# Patient Record
Sex: Female | Born: 1980 | Race: Black or African American | Hispanic: No | Marital: Single | Smoking: Current some day smoker
Health system: Southern US, Community
[De-identification: ages and names within clinical notes are randomized; demographics above are authoritative.]

## PROBLEM LIST (undated history)

## (undated) ENCOUNTER — Inpatient Hospital Stay (HOSPITAL_COMMUNITY): Payer: Self-pay

## (undated) DIAGNOSIS — D573 Sickle-cell trait: Secondary | ICD-10-CM

## (undated) DIAGNOSIS — R87619 Unspecified abnormal cytological findings in specimens from cervix uteri: Secondary | ICD-10-CM

## (undated) DIAGNOSIS — IMO0002 Reserved for concepts with insufficient information to code with codable children: Secondary | ICD-10-CM

## (undated) HISTORY — PX: DILATION AND CURETTAGE OF UTERUS: SHX78

## (undated) HISTORY — DX: Sickle-cell trait: D57.3

---

## 2002-08-06 ENCOUNTER — Emergency Department (HOSPITAL_COMMUNITY): Admission: EM | Admit: 2002-08-06 | Discharge: 2002-08-07 | Payer: Self-pay | Admitting: Emergency Medicine

## 2002-08-07 ENCOUNTER — Encounter: Payer: Self-pay | Admitting: Emergency Medicine

## 2004-11-01 ENCOUNTER — Emergency Department (HOSPITAL_COMMUNITY): Admission: EM | Admit: 2004-11-01 | Discharge: 2004-11-01 | Payer: Self-pay | Admitting: Emergency Medicine

## 2004-11-24 ENCOUNTER — Inpatient Hospital Stay (HOSPITAL_COMMUNITY): Admission: AD | Admit: 2004-11-24 | Discharge: 2004-11-24 | Payer: Self-pay | Admitting: *Deleted

## 2005-01-29 ENCOUNTER — Ambulatory Visit (HOSPITAL_COMMUNITY): Admission: RE | Admit: 2005-01-29 | Discharge: 2005-01-29 | Payer: Self-pay | Admitting: *Deleted

## 2005-05-18 ENCOUNTER — Observation Stay (HOSPITAL_COMMUNITY): Admission: AD | Admit: 2005-05-18 | Discharge: 2005-05-19 | Payer: Self-pay | Admitting: *Deleted

## 2005-05-26 ENCOUNTER — Ambulatory Visit (HOSPITAL_COMMUNITY): Admission: RE | Admit: 2005-05-26 | Discharge: 2005-05-26 | Payer: Self-pay | Admitting: *Deleted

## 2005-06-15 ENCOUNTER — Inpatient Hospital Stay (HOSPITAL_COMMUNITY): Admission: AD | Admit: 2005-06-15 | Discharge: 2005-06-18 | Payer: Self-pay | Admitting: Obstetrics and Gynecology

## 2005-06-15 ENCOUNTER — Ambulatory Visit: Payer: Self-pay | Admitting: Obstetrics and Gynecology

## 2006-12-10 ENCOUNTER — Emergency Department (HOSPITAL_COMMUNITY): Admission: EM | Admit: 2006-12-10 | Discharge: 2006-12-10 | Payer: Self-pay | Admitting: Emergency Medicine

## 2007-03-22 ENCOUNTER — Inpatient Hospital Stay (HOSPITAL_COMMUNITY): Admission: AD | Admit: 2007-03-22 | Discharge: 2007-03-23 | Payer: Self-pay | Admitting: Obstetrics & Gynecology

## 2007-03-25 ENCOUNTER — Inpatient Hospital Stay (HOSPITAL_COMMUNITY): Admission: AD | Admit: 2007-03-25 | Discharge: 2007-03-25 | Payer: Self-pay | Admitting: Obstetrics & Gynecology

## 2007-03-25 ENCOUNTER — Encounter: Payer: Self-pay | Admitting: Obstetrics & Gynecology

## 2007-09-12 ENCOUNTER — Ambulatory Visit (HOSPITAL_COMMUNITY): Admission: RE | Admit: 2007-09-12 | Discharge: 2007-09-12 | Payer: Self-pay | Admitting: Obstetrics & Gynecology

## 2008-01-16 ENCOUNTER — Inpatient Hospital Stay (HOSPITAL_COMMUNITY): Admission: AD | Admit: 2008-01-16 | Discharge: 2008-01-16 | Payer: Self-pay | Admitting: Obstetrics & Gynecology

## 2008-01-16 ENCOUNTER — Ambulatory Visit: Payer: Self-pay | Admitting: Advanced Practice Midwife

## 2008-02-02 ENCOUNTER — Ambulatory Visit (HOSPITAL_COMMUNITY): Admission: RE | Admit: 2008-02-02 | Discharge: 2008-02-02 | Payer: Self-pay | Admitting: Family Medicine

## 2008-02-15 ENCOUNTER — Inpatient Hospital Stay (HOSPITAL_COMMUNITY): Admission: AD | Admit: 2008-02-15 | Discharge: 2008-02-17 | Payer: Self-pay | Admitting: Obstetrics & Gynecology

## 2008-02-15 ENCOUNTER — Ambulatory Visit: Payer: Self-pay | Admitting: Physician Assistant

## 2010-11-19 LAB — WET PREP, GENITAL: Trich, Wet Prep: NONE SEEN

## 2010-11-19 LAB — GC/CHLAMYDIA PROBE AMP, GENITAL: GC Probe Amp, Genital: NEGATIVE

## 2010-11-19 LAB — CBC
Hemoglobin: 12.7
MCHC: 34.4
MCV: 82.3
RBC: 4.48
WBC: 12.1 — ABNORMAL HIGH

## 2010-11-19 LAB — POCT PREGNANCY, URINE
Operator id: 12087
Preg Test, Ur: POSITIVE

## 2010-12-04 LAB — CBC
HCT: 38.1 % (ref 36.0–46.0)
MCHC: 33.7 g/dL (ref 30.0–36.0)
MCV: 82.2 fL (ref 78.0–100.0)
Platelets: 215 10*3/uL (ref 150–400)
WBC: 9.4 10*3/uL (ref 4.0–10.5)

## 2011-02-02 ENCOUNTER — Emergency Department (INDEPENDENT_AMBULATORY_CARE_PROVIDER_SITE_OTHER)
Admission: EM | Admit: 2011-02-02 | Discharge: 2011-02-02 | Disposition: A | Discharge status: Home / Self Care | Payer: Self-pay | Source: Home / Self Care | Attending: Family Medicine | Admitting: Family Medicine

## 2011-02-02 ENCOUNTER — Encounter: Payer: Self-pay | Admitting: Emergency Medicine

## 2011-02-02 DIAGNOSIS — Z331 Pregnant state, incidental: Secondary | ICD-10-CM

## 2011-02-02 LAB — POCT URINALYSIS DIP (DEVICE)
Ketones, ur: NEGATIVE mg/dL
Protein, ur: NEGATIVE mg/dL
Specific Gravity, Urine: >=1.03 (ref 1.005–1.030)
Urobilinogen, UA: 0.2 mg/dL (ref 0.0–1.0)
pH: 6 (ref 5.0–8.0)

## 2011-02-02 NOTE — ED Notes (Signed)
Pt here with lower back pain radiating down bilat legs x 1wk and late menstrual x 1 mnth.sx cramping intermitt with worsening this past 2 dys.no vomiting or fevers reported

## 2011-02-02 NOTE — ED Provider Notes (Addendum)
History     CSN: [id number] Arrival date & time: 02/02/2011  1:50 PM   First MD Initiated Contact with Patient 02/02/11 1421      Chief Complaint  Patient presents with  . Back Pain  . Late Period    (Consider location/radiation/quality/duration/timing/severity/associated sxs/prior treatment) Patient is a 30 y.o. female presenting with back pain. The history is provided by the patient.  Back Pain  This is a new problem. The current episode started 2 days ago. The problem occurs constantly. The problem has not changed since onset.The pain is associated with no known injury (lmp oct 12). The pain is present in the lumbar spine. The quality of the pain is described as cramping. The pain does not radiate. The pain is mild.    No past medical history on file.  No past surgical history on file.  No family history on file.  History  Substance Use Topics  . Smoking status: Not on file  . Smokeless tobacco: Not on file  . Alcohol Use: Not on file    OB History    No data available      Review of Systems  Constitutional: Negative.   Gastrointestinal: Negative.   Genitourinary: Negative for vaginal bleeding and vaginal discharge.  Musculoskeletal: Positive for back pain.    Allergies  Review of patient's allergies indicates not on file.  Home Medications  No current outpatient prescriptions on file.  BP 102/43  Pulse 65  Temp(Src) 99 F (37.2 C) (Oral)  Resp 16  SpO2 100%  Physical Exam  Nursing note and vitals reviewed. Constitutional: She is oriented to person, place, and time. She appears well-developed and well-nourished.  Abdominal: Soft. Bowel sounds are normal. She exhibits no mass. There is no tenderness. There is no rebound and no guarding.  Neurological: She is alert and oriented to person, place, and time.  Skin: Skin is warm and dry.    ED Course  Procedures (including critical care time)   Labs Reviewed  POCT URINALYSIS DIPSTICK  POCT  PREGNANCY, URINE   No results found.   No diagnosis found.    MDM   upreg--pos   , u/a neg.Barkley Bruns, MD 02/02/11 1605  Barkley Bruns, MD 02/02/11 [phone]

## 2011-03-02 NOTE — L&D Delivery Note (Signed)
Delivery Note At 11:29 AM a viable and healthy female was delivered via Vaginal, Spontaneous Delivery (Presentation: Occiput Posterior).  APGAR: pending ; weight pending.   Placenta status: intact.  Cord: 3 vessels with the following complications: none.   Anesthesia: Epidural  Episiotomy: none  Lacerations: 1st degree perineal Suture Repair: none Est. Blood Loss (mL): 350  Mom to postpartum, to OR in 1 hour for BTL.  Baby to nursery-stable.  Diana Liu 09/11/2011, 11:52 AM

## 2011-04-19 ENCOUNTER — Other Ambulatory Visit (HOSPITAL_COMMUNITY): Payer: Self-pay | Admitting: Physician Assistant

## 2011-04-19 DIAGNOSIS — Z3689 Encounter for other specified antenatal screening: Secondary | ICD-10-CM

## 2011-04-19 LAB — OB RESULTS CONSOLE ABO/RH

## 2011-04-19 LAB — OB RESULTS CONSOLE ANTIBODY SCREEN: Antibody Screen: NEGATIVE

## 2011-04-19 LAB — OB RESULTS CONSOLE HIV ANTIBODY (ROUTINE TESTING): HIV: NONREACTIVE

## 2011-04-19 LAB — OB RESULTS CONSOLE RUBELLA ANTIBODY, IGM: Rubella: IMMUNE

## 2011-04-19 LAB — OB RESULTS CONSOLE HEPATITIS B SURFACE ANTIGEN: Hepatitis B Surface Ag: NEGATIVE

## 2011-04-23 ENCOUNTER — Ambulatory Visit (HOSPITAL_COMMUNITY)
Admission: RE | Admit: 2011-04-23 | Discharge: 2011-04-23 | Disposition: A | Discharge status: Home / Self Care | Payer: Medicaid Other | Source: Ambulatory Visit | Attending: Physician Assistant | Admitting: Physician Assistant

## 2011-04-23 DIAGNOSIS — Z1389 Encounter for screening for other disorder: Secondary | ICD-10-CM | POA: Insufficient documentation

## 2011-04-23 DIAGNOSIS — Z363 Encounter for antenatal screening for malformations: Secondary | ICD-10-CM | POA: Insufficient documentation

## 2011-04-23 DIAGNOSIS — Z3689 Encounter for other specified antenatal screening: Secondary | ICD-10-CM

## 2011-04-23 DIAGNOSIS — O358XX0 Maternal care for other (suspected) fetal abnormality and damage, not applicable or unspecified: Secondary | ICD-10-CM | POA: Insufficient documentation

## 2011-05-13 ENCOUNTER — Encounter: Payer: Medicaid Other | Admitting: Physician Assistant

## 2011-05-19 ENCOUNTER — Other Ambulatory Visit (HOSPITAL_COMMUNITY): Payer: Self-pay | Admitting: Physician Assistant

## 2011-05-28 ENCOUNTER — Ambulatory Visit (HOSPITAL_COMMUNITY)
Admission: RE | Admit: 2011-05-28 | Discharge: 2011-05-28 | Disposition: A | Discharge status: Home / Self Care | Payer: Medicaid Other | Source: Ambulatory Visit | Attending: Physician Assistant | Admitting: Physician Assistant

## 2011-05-28 DIAGNOSIS — Z3689 Encounter for other specified antenatal screening: Secondary | ICD-10-CM | POA: Insufficient documentation

## 2011-05-28 DIAGNOSIS — O3660X0 Maternal care for excessive fetal growth, unspecified trimester, not applicable or unspecified: Secondary | ICD-10-CM | POA: Insufficient documentation

## 2011-06-02 ENCOUNTER — Encounter: Payer: Self-pay | Admitting: Physician Assistant

## 2011-06-02 ENCOUNTER — Ambulatory Visit (INDEPENDENT_AMBULATORY_CARE_PROVIDER_SITE_OTHER): Payer: Medicaid Other | Admitting: Physician Assistant

## 2011-06-02 VITALS — BP 130/68 | HR 96 | Temp 98.3°F | Resp 16 | Ht 66.0 in | Wt 188.4 lb

## 2011-06-02 DIAGNOSIS — R87613 High grade squamous intraepithelial lesion on cytologic smear of cervix (HGSIL): Secondary | ICD-10-CM | POA: Insufficient documentation

## 2011-06-02 DIAGNOSIS — IMO0002 Reserved for concepts with insufficient information to code with codable children: Secondary | ICD-10-CM | POA: Insufficient documentation

## 2011-06-02 NOTE — Progress Notes (Signed)
Pt presents for colpo secondary to HSIL.Pt is [redacted]w[redacted]d pregnant with no prior abnormal paps. Patient given informed consent, signed copy in the chart, time out was performed.  Placed in lithotomy position. Cervix viewed with speculum and colposcope after application of acetic acid.   Colposcopy adequate?  Yes Acetowhite lesions?No Punctation?No Mosaicism?  No Abnormal vasculature?  No Biopsies?No ECC?NO  COMMENTS:Needs repeat colpo postpartum with ECC. Patient was given post procedure instructions.  Results shared with patient. FU as scheduled for [facility]. FU in gyn clinic 8-12 weeks postpartum for repeat colpo

## 2011-06-02 NOTE — Progress Notes (Signed)
Pt is receiving prenatal care from [facility]

## 2011-06-02 NOTE — Patient Instructions (Signed)
Colposcopy Colposcopy is a procedure that uses a special lighted microscope (colposcope). It examines your cervix and vagina, or the area around the outside of the vagina, for signs of disease or abnormalities in the cells. You may be sent to a specialist (gynecologist) to do the colposcopy. A biopsy (tissue sample) may be collected during a colposcopy, if the caregiver finds any unusual cells. The biopsy is sent to the lab for further testing, and the results are reported back to your caregiver. A WOMAN MAY NEED THIS PROCEDURE IF:  She has had an abnormal pap smear (taking cells from the cervix for testing).   She has a sore on her cervix, and a Pap test was normal.   The Pap test suggests human papilloma virus (HPV). This virus can cause genital warts and is linked to the development of cervical cancer.   She has genital warts on the cervix, or in or around the outside of the vagina.   Her mother took the drug DES while pregnant.   She has painful intercourse.   She has vaginal bleeding, especially after sexual intercourse.   There is a need to evaluate the results of previous treatment.  BEFORE THE PROCEDURE   Colposcopy is done when you are not having a menstrual period.   For 24 hours before the colposcopy, do not:   Douche.   Use tampons.   Use medicines, creams, or suppositories in the vagina.   Have sexual intercourse.  PROCEDURE   A colposcopy is done while a woman is lying on her back with her feet in foot rests (stirrups).   A speculum is placed inside the vagina to keep it open and to allow the caregiver to see the cervix. This is the same instrument used to do a pap smear.   The colposcope is placed outside the vagina. It is used to magnify and examine the cervix, vagina, and the area around the outside of the vagina.   A small amount of liquid solution is placed on the area that is to be viewed. This solution is placed on with a cotton applicator. This solution  makes it easier to see the abnormal cells.   Your caregiver will suck out mucus and cells from the canal of the cervix.   Small pieces of tissue for biopsy may be taken at the same time. You may feel mild pain or discomfort when this is done.   Your caregiver will record the location of the abnormal areas and send the tissue samples to a lab for analysis.   If your caregiver biopsies the vagina or outside of the vagina, a local anesthetic (novocaine) is usually given.  AFTER THE PROCEDURE   You may have some cramping that often goes away in a few minutes. You may have some soreness for a couple of days.   You may take over-the-counter pain medicine as advised by your caregiver. Do not take aspirin because it can cause bleeding.   Lie down for a few minutes if you feel lightheaded.   You may have some bleeding or dark discharge that should stop in a few days.   You may need to wear a sanitary pad for a few days.  HOME CARE INSTRUCTIONS   Avoid sex, douching, and using tampons for a week or as directed.   Only take medicine as directed by your caregiver.   Continue to take birth control pills, if you are on them.   Not all test results are   available during your visit. If your test results are not back during the visit, make an appointment with your caregiver to find out the results. Do not assume everything is normal if you have not heard from your caregiver or the medical facility. It is important for you to follow up on all of your test results.   Follow your caregiver's advice regarding medicines, activity, follow-up visits, and follow-up Pap tests.  SEEK MEDICAL CARE IF:   You develop a rash.   You have problems with your medicine.  SEEK IMMEDIATE MEDICAL CARE IF:  You are bleeding heavily or are passing blood clots.   You develop a fever over 102 F (38.9 C), with or without chills.   You have abnormal vaginal discharge.   You are having cramps that do not go away  after taking your pain medicine.   You feel lightheaded, dizzy, or faint.   You develop stomach pain.  Document Released: 05/08/2002 Document Revised: 02/04/2011 Document Reviewed: 12/19/2008 [employer] Patient Information 2012 [employer]. 

## 2011-08-06 ENCOUNTER — Inpatient Hospital Stay (HOSPITAL_COMMUNITY)
Admission: RE | Admit: 2011-08-06 | Discharge: 2011-08-06 | Disposition: A | Discharge status: Home / Self Care | Payer: Medicaid Other | Source: Ambulatory Visit | Attending: Obstetrics & Gynecology | Admitting: Obstetrics & Gynecology

## 2011-08-06 ENCOUNTER — Encounter (HOSPITAL_COMMUNITY): Payer: Self-pay | Admitting: *Deleted

## 2011-08-06 DIAGNOSIS — O99891 Other specified diseases and conditions complicating pregnancy: Secondary | ICD-10-CM | POA: Insufficient documentation

## 2011-08-06 DIAGNOSIS — B9789 Other viral agents as the cause of diseases classified elsewhere: Secondary | ICD-10-CM | POA: Insufficient documentation

## 2011-08-06 DIAGNOSIS — R000 Tachycardia, unspecified: Secondary | ICD-10-CM | POA: Insufficient documentation

## 2011-08-06 DIAGNOSIS — B349 Viral infection, unspecified: Secondary | ICD-10-CM

## 2011-08-06 DIAGNOSIS — O10019 Pre-existing essential hypertension complicating pregnancy, unspecified trimester: Secondary | ICD-10-CM

## 2011-08-06 DIAGNOSIS — H5710 Ocular pain, unspecified eye: Secondary | ICD-10-CM | POA: Insufficient documentation

## 2011-08-06 HISTORY — DX: Reserved for concepts with insufficient information to code with codable children: IMO0002

## 2011-08-06 HISTORY — DX: Unspecified abnormal cytological findings in specimens from cervix uteri: R87.619

## 2011-08-06 LAB — DIFFERENTIAL
Basophils Absolute: 0 10*3/uL (ref 0.0–0.1)
Lymphocytes Relative: 9 % — ABNORMAL LOW (ref 12–46)
Monocytes Absolute: 0.7 10*3/uL (ref 0.1–1.0)
Neutro Abs: 8.2 10*3/uL — ABNORMAL HIGH (ref 1.7–7.7)
Neutrophils Relative %: 83 % — ABNORMAL HIGH (ref 43–77)

## 2011-08-06 LAB — URINALYSIS, ROUTINE W REFLEX MICROSCOPIC
Bilirubin Urine: NEGATIVE
Hgb urine dipstick: NEGATIVE
Ketones, ur: NEGATIVE mg/dL
Nitrite: NEGATIVE
Specific Gravity, Urine: 1.015 (ref 1.005–1.030)
pH: 6 (ref 5.0–8.0)

## 2011-08-06 LAB — CBC
Hemoglobin: 11.3 g/dL — ABNORMAL LOW (ref 12.0–15.0)
MCH: 26.9 pg (ref 26.0–34.0)
MCHC: 35.8 g/dL (ref 30.0–36.0)
MCV: 75.2 fL — ABNORMAL LOW (ref 78.0–100.0)
RBC: 4.2 MIL/uL (ref 3.87–5.11)

## 2011-08-06 LAB — COMPREHENSIVE METABOLIC PANEL
ALT: 15 U/L (ref 0–35)
AST: 17 U/L (ref 0–37)
Alkaline Phosphatase: 122 U/L — ABNORMAL HIGH (ref 39–117)
CO2: 25 mEq/L (ref 19–32)
Chloride: 101 mEq/L (ref 96–112)
GFR calc non Af Amer: >90 mL/min (ref >90)
Potassium: 4.1 mEq/L (ref 3.5–5.1)
Sodium: 134 mEq/L — ABNORMAL LOW (ref 135–145)
Total Bilirubin: 0.3 mg/dL (ref 0.3–1.2)

## 2011-08-06 NOTE — MAU Note (Signed)
Pt sent over from [facility] for weakness and fatigue, reports pain in eyes- worse with lights, states she believes her heart is racing.  States all she does is sleep, too fatigued to do anything else.  Denies any abdominal pain, bleeding, discharge or leaking of fluid.  + FM.

## 2011-08-06 NOTE — MAU Provider Note (Signed)
History    Diana Liu is a 31 yo [phone] at 34wk 1 day who presents today with a  3 day h/o intermittent fatigue, body aches, blurry vision with :black spots" and tachycardia. Pt reports fatigue at baseline but has worsened over the past 3 days. Body aches of arms and legs to the point she feels her legs want to "give out". Pt reports intermittent HA assoc with vision changes and dizziness lasting 30 min with each episode. Pt has tried Tylenol for HA with some relief. Otherwise, sxs relieved with "lying down". Pt denies hx similar issue during pregnancy.  Pt has been eating 2 meals/day and staying hydrated.No changes to UO.  Denies fluid loss, blood loss, abd pain. Endorses Braxton Hicks contractions - 3x/night currently.  CSN: [id number]  Arrival date and time: 08/06/11 1115   First Provider Initiated Contact with Patient 08/06/11 1228      Chief Complaint  Patient presents with  . Eye Pain  . Tachycardia   Eye Pain  There is pain in both eyes. This is a new problem. The current episode started in the past 7 days. The problem occurs 2 to 4 times per day. The problem has been unchanged. There was no injury mechanism. The pain is at a severity of 6/10. The pain is mild. There is no known exposure to pink eye. Associated symptoms include blurred vision (intermittently), an eye discharge (watery) and photophobia (dim light in MAU room causes sensitivity). Pertinent negatives include no double vision, eye redness, fever, itching, nausea, vomiting or weakness. She has tried nothing for the symptoms.     Past Medical History  Diagnosis Date  . Sickle cell trait   . Abnormal Pap smear     Past Surgical History  Procedure Date  . Dilation and curettage of uterus     Family History  Problem Relation Age of Onset  . Hypertension Mother   . Diabetes Mother   . Anesthesia problems Neg Hx     History  Substance Use Topics  . Smoking status: Never Smoker   . Smokeless tobacco: Not on  file  . Alcohol Use: No    Allergies: No Known Allergies  Prescriptions prior to admission  Medication Sig Dispense Refill  . calcium carbonate (TUMS - DOSED IN MG ELEMENTAL CALCIUM) 500 MG chewable tablet Chew 1 tablet by mouth daily as needed. heartburn      . Prenatal Vit-Fe Fumarate-FA (PRENATAL MULTIVITAMIN) TABS Take 1 tablet by mouth daily.      Marland Kitchen sulfamethoxazole-trimethoprim (BACTRIM DS) 800-160 MG per tablet Take 1 tablet by mouth at bedtime.        Review of Systems  Constitutional: Positive for malaise/fatigue (baseline fatigue with increased severity and frequency over past 3 days). Negative for fever, chills and diaphoresis.  HENT: Negative for ear pain, congestion, sore throat and tinnitus.   Eyes: Positive for blurred vision (intermittently), photophobia (dim light in MAU room causes sensitivity), pain and discharge (watery). Negative for double vision and redness.  Respiratory: Positive for shortness of breath (at baseline worsening in 3rd trimester). Negative for cough, wheezing and stridor.   Cardiovascular: Positive for palpitations (tachycardia). Negative for chest pain and leg swelling.  Gastrointestinal: Positive for heartburn. Negative for nausea, vomiting, abdominal pain, diarrhea and constipation.  Genitourinary: Negative for dysuria, urgency, frequency and flank pain.  Musculoskeletal: Positive for myalgias (bilateral legs and arms).  Skin: Negative.  Negative for itching and rash.  Neurological: Positive for dizziness (with HA episodes) and  headaches (intermittent, lasting -1hr, associated with vision changes). Negative for seizures and weakness.  Psychiatric/Behavioral: Negative for depression. The patient is not nervous/anxious.    Physical Exam   Blood pressure 128/69, pulse 103, temperature 98.9 F (37.2 C), temperature source Oral, resp. rate 18, height 5\' 6"  (1.676 m), weight 91.627 kg (202 lb), last menstrual period 12/10/2010.  Physical Exam    Constitutional: She appears well-developed and well-nourished. She appears distressed.  HENT:  Head: Normocephalic.  Eyes: Conjunctivae and EOM are normal. Pupils are equal, round, and reactive to light. Right eye exhibits discharge (watery). Right eye exhibits no exudate. Left eye exhibits discharge. Left eye exhibits no exudate. Right conjunctiva is not injected. Left conjunctiva is not injected. No scleral icterus.  Cardiovascular: Regular rhythm, normal heart sounds and intact distal pulses.   No murmur heard. Respiratory: Effort normal and breath sounds normal. No respiratory distress. She has no wheezes. She has no rales.  GI: She exhibits distension and mass. There is no tenderness.  Genitourinary: No vaginal discharge found.  Musculoskeletal: She exhibits no edema.  Neurological: She is alert.  Skin: Skin is warm and dry. No rash noted. She is not diaphoretic.   Results for orders placed during the hospital encounter of 08/06/11 (from the past 24 hour(s))  CBC     Status: Abnormal   Collection Time   08/06/11 12:34 PM      Component Value Range   WBC 10.0  4.0 - 10.5 (K/uL)   RBC 4.20  3.87 - 5.11 (MIL/uL)   Hemoglobin 11.3 (*) 12.0 - 15.0 (g/dL)   HCT 52.8 (*) 41.3 - 46.0 (%)   MCV 75.2 (*) 78.0 - 100.0 (fL)   MCH 26.9  26.0 - 34.0 (pg)   MCHC 35.8  30.0 - 36.0 (g/dL)   RDW 24.4  01.0 - 27.2 (%)   Platelets 186  150 - 400 (K/uL)  DIFFERENTIAL     Status: Abnormal   Collection Time   08/06/11 12:34 PM      Component Value Range   Neutrophils Relative 83 (*) 43 - 77 (%)   Neutro Abs 8.2 (*) 1.7 - 7.7 (K/uL)   Lymphocytes Relative 9 (*) 12 - 46 (%)   Lymphs Abs 0.9  0.7 - 4.0 (K/uL)   Monocytes Relative 7  3 - 12 (%)   Monocytes Absolute 0.7  0.1 - 1.0 (K/uL)   Eosinophils Relative 1  0 - 5 (%)   Eosinophils Absolute 0.1  0.0 - 0.7 (K/uL)   Basophils Relative 0  0 - 1 (%)   Basophils Absolute 0.0  0.0 - 0.1 (K/uL)  COMPREHENSIVE METABOLIC PANEL     Status: Abnormal    Collection Time   08/06/11 12:34 PM      Component Value Range   Sodium 134 (*) 135 - 145 (mEq/L)   Potassium 4.1  3.5 - 5.1 (mEq/L)   Chloride 101  96 - 112 (mEq/L)   CO2 25  19 - 32 (mEq/L)   Glucose, Bld 101 (*) 70 - 99 (mg/dL)   BUN 5 (*) 6 - 23 (mg/dL)   Creatinine, Ser 5.36  0.50 - 1.10 (mg/dL)   Calcium 9.1  8.4 - 64.4 (mg/dL)   Total Protein 6.8  6.0 - 8.3 (g/dL)   Albumin 3.1 (*) 3.5 - 5.2 (g/dL)   AST 17  0 - 37 (U/L)   ALT 15  0 - 35 (U/L)   Alkaline Phosphatase 122 (*) 39 - 117 (  U/L)   Total Bilirubin 0.3  0.3 - 1.2 (mg/dL)   GFR calc non Af Amer >90  >90 (mL/min)   GFR calc Af Amer >90  >90 (mL/min)  URINALYSIS, ROUTINE W REFLEX MICROSCOPIC     Status: Abnormal   Collection Time   08/06/11 12:36 PM      Component Value Range   Color, Urine YELLOW  YELLOW    APPearance CLEAR  CLEAR    Specific Gravity, Urine 1.015  1.005 - 1.030    pH 6.0  5.0 - 8.0    Glucose, UA 500 (*) NEGATIVE (mg/dL)   Hgb urine dipstick NEGATIVE  NEGATIVE    Bilirubin Urine NEGATIVE  NEGATIVE    Ketones, ur NEGATIVE  NEGATIVE (mg/dL)   Protein, ur NEGATIVE  NEGATIVE (mg/dL)   Urobilinogen, UA 1.0  0.0 - 1.0 (mg/dL)   Nitrite NEGATIVE  NEGATIVE    Leukocytes, UA NEGATIVE  NEGATIVE      MAU Course  Procedures NST: Baseline rate of 160 with accelerations seen. Occasional contraction. No decelerations noted.  Assessment and Plan  #1 intrauterine pregnancy at 34 weeks 1 day #2 viral illness  Lab testing shows no acute infection of symptoms consistent with viral etiology. Preeclamptic warnings given to the patient with instructions to return if develops headache that it does not respond to Tylenol or she has labor symptoms. Patient will followup in one week with health department.  Feser, Danielle 08/06/2011, 1:49 PM   Patient seen - agree with above note.

## 2011-08-06 NOTE — Discharge Instructions (Signed)
Viral Infections  A virus is a type of germ. Viruses can cause:   Minor sore throats.   Aches and pains.   Headaches.   Runny nose.   Rashes.   Watery eyes.   Tiredness.   Coughs.   Loss of appetite.   Feeling sick to your stomach (nausea).   Throwing up (vomiting).   Watery poop (diarrhea).  HOME CARE     Only take medicines as told by your doctor.   Drink enough water and fluids to keep your pee (urine) clear or pale yellow. Sports drinks are a good choice.   Get plenty of rest and eat healthy. Soups and broths with crackers or rice are fine.  GET HELP RIGHT AWAY IF:     You have a very bad headache.   You have shortness of breath.   You have chest pain or neck pain.   You have an unusual rash.   You cannot stop throwing up.   You have watery poop that does not stop.   You cannot keep fluids down.   You or your child has a temperature by mouth above 102 F (38.9 C), not controlled by medicine.   Your baby is older than 3 months with a rectal temperature of 102 F (38.9 C) or higher.   Your baby is 3 months old or younger with a rectal temperature of 100.4 F (38 C) or higher.  MAKE SURE YOU:     Understand these instructions.   Will watch this condition.   Will get help right away if you are not doing well or get worse.  Document Released: 01/29/2008 Document Revised: 02/04/2011 Document Reviewed: 06/23/2010  [employer] Patient Information 2012 [employer].

## 2011-08-19 ENCOUNTER — Other Ambulatory Visit (HOSPITAL_COMMUNITY): Payer: Self-pay | Admitting: Physician Assistant

## 2011-08-19 DIAGNOSIS — O289 Unspecified abnormal findings on antenatal screening of mother: Secondary | ICD-10-CM

## 2011-08-20 ENCOUNTER — Other Ambulatory Visit (HOSPITAL_COMMUNITY): Payer: Self-pay | Admitting: Physician Assistant

## 2011-08-20 ENCOUNTER — Ambulatory Visit (HOSPITAL_COMMUNITY)
Admission: RE | Admit: 2011-08-20 | Discharge: 2011-08-20 | Disposition: A | Discharge status: Home / Self Care | Payer: Medicaid Other | Source: Ambulatory Visit | Attending: Physician Assistant | Admitting: Physician Assistant

## 2011-08-20 DIAGNOSIS — O289 Unspecified abnormal findings on antenatal screening of mother: Secondary | ICD-10-CM

## 2011-08-20 DIAGNOSIS — Z3689 Encounter for other specified antenatal screening: Secondary | ICD-10-CM | POA: Insufficient documentation

## 2011-08-20 DIAGNOSIS — O3660X0 Maternal care for excessive fetal growth, unspecified trimester, not applicable or unspecified: Secondary | ICD-10-CM | POA: Insufficient documentation

## 2011-08-26 ENCOUNTER — Other Ambulatory Visit (HOSPITAL_COMMUNITY): Payer: Self-pay | Admitting: Physician Assistant

## 2011-08-26 DIAGNOSIS — O289 Unspecified abnormal findings on antenatal screening of mother: Secondary | ICD-10-CM

## 2011-08-30 ENCOUNTER — Other Ambulatory Visit (HOSPITAL_COMMUNITY): Payer: Self-pay | Admitting: Physician Assistant

## 2011-08-30 ENCOUNTER — Ambulatory Visit (HOSPITAL_COMMUNITY)
Admission: RE | Admit: 2011-08-30 | Discharge: 2011-08-30 | Disposition: A | Discharge status: Home / Self Care | Payer: Medicaid Other | Source: Ambulatory Visit | Attending: Physician Assistant | Admitting: Physician Assistant

## 2011-08-30 DIAGNOSIS — Z3689 Encounter for other specified antenatal screening: Secondary | ICD-10-CM | POA: Insufficient documentation

## 2011-08-30 DIAGNOSIS — O289 Unspecified abnormal findings on antenatal screening of mother: Secondary | ICD-10-CM

## 2011-09-06 ENCOUNTER — Ambulatory Visit (HOSPITAL_COMMUNITY)
Admission: RE | Admit: 2011-09-06 | Discharge: 2011-09-06 | Disposition: A | Discharge status: Home / Self Care | Payer: Medicaid Other | Source: Ambulatory Visit | Attending: Physician Assistant | Admitting: Physician Assistant

## 2011-09-06 ENCOUNTER — Inpatient Hospital Stay (HOSPITAL_COMMUNITY)
Admission: AD | Admit: 2011-09-06 | Discharge: 2011-09-06 | Disposition: A | Discharge status: Hospice | Payer: Medicaid Other | Source: Ambulatory Visit | Attending: Obstetrics & Gynecology | Admitting: Obstetrics & Gynecology

## 2011-09-06 ENCOUNTER — Encounter (HOSPITAL_COMMUNITY): Payer: Self-pay | Admitting: *Deleted

## 2011-09-06 ENCOUNTER — Inpatient Hospital Stay (HOSPITAL_COMMUNITY): Payer: Medicaid Other

## 2011-09-06 DIAGNOSIS — Z3689 Encounter for other specified antenatal screening: Secondary | ICD-10-CM | POA: Insufficient documentation

## 2011-09-06 DIAGNOSIS — O368390 Maternal care for abnormalities of the fetal heart rate or rhythm, unspecified trimester, not applicable or unspecified: Secondary | ICD-10-CM | POA: Insufficient documentation

## 2011-09-06 DIAGNOSIS — F17200 Nicotine dependence, unspecified, uncomplicated: Secondary | ICD-10-CM | POA: Diagnosis present

## 2011-09-06 DIAGNOSIS — O368190 Decreased fetal movements, unspecified trimester, not applicable or unspecified: Secondary | ICD-10-CM | POA: Insufficient documentation

## 2011-09-06 DIAGNOSIS — D582 Other hemoglobinopathies: Secondary | ICD-10-CM | POA: Diagnosis present

## 2011-09-06 DIAGNOSIS — O289 Unspecified abnormal findings on antenatal screening of mother: Secondary | ICD-10-CM

## 2011-09-06 DIAGNOSIS — Z3490 Encounter for supervision of normal pregnancy, unspecified, unspecified trimester: Secondary | ICD-10-CM

## 2011-09-06 NOTE — MAU Provider Note (Signed)
  History     CSN: [id number]  Arrival date and time: 09/06/11 1449   None     Chief Complaint  Patient presents with  . Decreased Fetal Movement  . Labor Eval   HPI Patient is a 31 yo [phone] at 63.4 EGA who presents due to decreased fetal activity and for labor evaluation.  Patient states that on July 4th she began to notice some contractions.  Currently she states she can feel them every 15 minutes or so.  Then last night she expelled a large amount of mucus.  There was no blood in this mucus. Since that time she noticed decreased fetal activity.  She states that previously her fetus was very active, but not nearly as much now.  She denies any vaginal bleeding and loss of fluid.  OB History    Grav Para Term Preterm Abortions TAB SAB Ect Mult Living   6 3 3  2 1 1   3     She receives her prenatal care with the [facility].  She states that they were concerned for "increased fluid" and large for gestational age.  Past Medical History  Diagnosis Date  . Sickle cell trait   . Abnormal Pap smear     Past Surgical History  Procedure Date  . Dilation and curettage of uterus     Family History  Problem Relation Age of Onset  . Hypertension Mother   . Diabetes Mother   . Anesthesia problems Neg Hx     History  Substance Use Topics  . Smoking status: Former Smoker -- 3 years    Types: Cigarettes  . Smokeless tobacco: Not on file  . Alcohol Use: No    Allergies: No Known Allergies  No prescriptions prior to admission    ROS negative except per HPI. Physical Exam   Blood pressure 127/72, pulse 92, temperature 99.2 F (37.3 C), temperature source Oral, resp. rate 18, height 5\' 5"  (1.651 m), weight 94.802 kg (209 lb), last menstrual period 12/10/2010, SpO2 100.00%.  Physical Exam  Constitutional: She appears well-developed and well-nourished.  Cardiovascular: Normal rate, regular rhythm and normal heart sounds.   Respiratory: Effort normal and breath sounds  normal. No respiratory distress.  GI:       Abdomen is gravid, firm during contraction, nontender   Fetal Heart tracing: baseline heart rate: 135, moderate variability, accels present, decels absent Contractions: irregular every 5-7 minutes     MAU Course  Procedures  BPP: 6/8 NST: reactive  >> Therfore, score is 8/10  Assessment and Plan  Patient is a [id number] at 38.4 EGA.  Decreased fetal movement  BPP was 6/8 with a reactive NST, reassuring for fetal well-being.  Patient was advised that her baby is doing well and that she is to return to the MAU if she continues to feel that there is decreased fetal activity, if she has onset of labor, loss of fluid, or for any other concerns.  She states she has a prenatal appointment on Thursday and she was advised to keep this appointment.  Marikay Alar 09/06/2011, 4:08 PM   Seen by me also, agree with note

## 2011-09-06 NOTE — MAU Note (Signed)
Pt states she has not felt the baby moving as much since last Friday.  No contractions, leaking or bleeding .

## 2011-09-06 NOTE — MAU Note (Signed)
Patient states she has felt less fetal movement than usual for the past 2 days. States having some contractions and vaginal discharge.

## 2011-09-08 NOTE — MAU Provider Note (Signed)
Attestation of Attending Supervision of Advanced Practitioner (CNM/NP): Evaluation and management procedures were performed by the Advanced Practitioner under my supervision and collaboration.  I have reviewed the Advanced Practitioner's note and chart, and I agree with the management and plan.  Jaynie Collins, M.D. 09/08/2011 4:53 PM

## 2011-09-10 ENCOUNTER — Encounter (HOSPITAL_COMMUNITY): Payer: Self-pay

## 2011-09-10 ENCOUNTER — Inpatient Hospital Stay (HOSPITAL_COMMUNITY)
Admission: RE | Admit: 2011-09-10 | Discharge: 2011-09-13 | DRG: 767 | Disposition: A | Discharge status: Home / Self Care | Payer: Medicaid Other | Source: Ambulatory Visit | Attending: Obstetrics & Gynecology | Admitting: Obstetrics & Gynecology

## 2011-09-10 VITALS — BP 110/73 | HR 62 | Temp 98.5°F | Resp 18 | Ht 65.0 in | Wt 207.0 lb

## 2011-09-10 DIAGNOSIS — O409XX0 Polyhydramnios, unspecified trimester, not applicable or unspecified: Secondary | ICD-10-CM | POA: Diagnosis present

## 2011-09-10 DIAGNOSIS — Z302 Encounter for sterilization: Secondary | ICD-10-CM

## 2011-09-10 DIAGNOSIS — Z3490 Encounter for supervision of normal pregnancy, unspecified, unspecified trimester: Secondary | ICD-10-CM

## 2011-09-10 LAB — CBC
Hemoglobin: 10.5 g/dL — ABNORMAL LOW (ref 12.0–15.0)
MCH: 25.4 pg — ABNORMAL LOW (ref 26.0–34.0)
MCHC: 34.9 g/dL (ref 30.0–36.0)
Platelets: 194 10*3/uL (ref 150–400)

## 2011-09-10 MED ORDER — LIDOCAINE HCL (PF) 1 % IJ SOLN
30.0000 mL | INTRAMUSCULAR | Status: DC | PRN
Start: 2011-09-10 — End: 2011-09-11
  Filled 2011-09-10: qty 30

## 2011-09-10 MED ORDER — ACETAMINOPHEN 325 MG PO TABS: 650.0000 mg | ORAL_TABLET | ORAL | Status: DC | PRN

## 2011-09-10 MED ORDER — CITRIC ACID-SODIUM CITRATE 334-500 MG/5ML PO SOLN: 30.0000 mL | ORAL | Status: DC | PRN

## 2011-09-10 MED ORDER — OXYCODONE-ACETAMINOPHEN 5-325 MG PO TABS: 1.0000 | ORAL_TABLET | ORAL | Status: DC | PRN

## 2011-09-10 MED ORDER — FLEET ENEMA 7-19 GM/118ML RE ENEM: 1.0000 | ENEMA | RECTAL | Status: DC | PRN

## 2011-09-10 MED ORDER — OXYTOCIN BOLUS FROM INFUSION
250.0000 mL | Freq: Once | INTRAVENOUS | Status: DC
  Filled 2011-09-10: qty 500

## 2011-09-10 MED ORDER — IBUPROFEN 600 MG PO TABS: 600.0000 mg | ORAL_TABLET | Freq: Four times a day (QID) | ORAL | Status: DC | PRN

## 2011-09-10 MED ORDER — ONDANSETRON HCL 4 MG/2ML IJ SOLN: 4.0000 mg | Freq: Four times a day (QID) | INTRAMUSCULAR | Status: DC | PRN

## 2011-09-10 MED ORDER — OXYTOCIN 40 UNITS IN LACTATED RINGERS INFUSION - SIMPLE MED
62.5000 mL/h | Freq: Once | INTRAVENOUS | Status: DC
  Filled 2011-09-10: qty 1000

## 2011-09-10 MED ORDER — LACTATED RINGERS IV SOLN
INTRAVENOUS | Status: DC
  Administered 2011-09-10 – 2011-09-11 (×3): via INTRAVENOUS

## 2011-09-10 MED ORDER — LACTATED RINGERS IV SOLN
500.0000 mL | INTRAVENOUS | Status: DC | PRN
Start: 2011-09-10 — End: 2011-09-11

## 2011-09-10 MED ORDER — NALBUPHINE SYRINGE 5 MG/0.5 ML
10.0000 mg | INJECTION | INTRAMUSCULAR | Status: DC | PRN
  Administered 2011-09-11: 10 mg via INTRAVENOUS
  Filled 2011-09-10: qty 1

## 2011-09-10 MED ORDER — ZOLPIDEM TARTRATE 5 MG PO TABS
5.0000 mg | ORAL_TABLET | Freq: Every evening | ORAL | Status: DC | PRN
  Administered 2011-09-11: 5 mg via ORAL
  Filled 2011-09-10: qty 1

## 2011-09-10 NOTE — Progress Notes (Signed)
Diana Liu is a 31 y.o. [id number] at [redacted]w[redacted]d admitted for induction of labor due to polyhydramnios at term.  Subjective: Pt comfortable with s/o at bedside for support.    Objective: BP 134/73  Pulse 104  Temp 98.4 F (36.9 C) (Oral)  Resp 24  Ht 5\' 5"  (1.651 m)  Wt 93.895 kg (207 lb)  BMI 34.45 kg/m2  LMP 12/10/2010      FHT:  FHR: 135 bpm, variability: moderate,  accelerations:  Present,  decelerations:  Absent UC:   irregular, every 5-8 minutes SVE:   1.5/40/-3, posterior, soft Foley bulb (Cook catheter) placed using speculum and ring forceps, pt tolerated well  Labs: Lab Results  Component Value Date   WBC 11.9* 09/10/2011   HGB 10.5* 09/10/2011   HCT 30.1* 09/10/2011   MCV 72.7* 09/10/2011   PLT 194 09/10/2011    Assessment / Plan: Induction of labor due to polyhydramnios Foley bulb in place  Labor: Progressing normally Preeclampsia:  N/A Fetal Wellbeing:  Category I Pain Control:  Labor support without medications I/D:  n/a Anticipated MOD:  NSVD  LEFTWICH-KIRBY, Sherline Eberwein 09/10/2011, 11:11 PM

## 2011-09-10 NOTE — H&P (Signed)
S: This is a 31 year old [id number] at [redacted]w[redacted]d by LMP presenting with IOL.  Complications this pregnancy: Polyhydramnios (AFI 24.07 cm on 09/07/11)  Contractions: q15 min Membranes: intact Vaginal bleeding: none Vaginal discharge: none Fetal movement: present  O: Filed Vitals:   09/10/11 1938  BP: 134/73  Pulse: 104  Temp: 98.4 F (36.9 C)  Resp: 24   Review of Systems - General ROS: negative for - chills, fever or night sweats Psychological ROS: negative Ophthalmic ROS: negative ENT ROS: negative Allergy and Immunology ROS: negative Hematological and Lymphatic ROS: negative Endocrine ROS: negative Respiratory ROS: no cough, shortness of breath, or wheezing Cardiovascular ROS: no chest pain or dyspnea on exertion Gastrointestinal ROS: no abdominal pain, change in bowel habits, or black or bloody stools Genito-Urinary ROS: no dysuria, trouble voiding, or hematuria Musculoskeletal ROS: negative Neurological ROS: no TIA or stroke symptoms Dermatological ROS: negative   Physical Examination: General appearance - alert, well appearing, and in no distress Mental status - alert, oriented to person, place, and time Eyes - pupils equal and reactive, extraocular eye movements intact Mouth - mucous membranes moist, pharynx normal without lesions Chest - clear to auscultation, no wheezes, rales or rhonchi, symmetric air entry Heart - normal rate, regular rhythm, normal S1, S2, no murmurs, rubs, clicks or gallops Abdomen - gravid, size c/w dates, nontender, no masses or organomegaly Back exam - full range of motion, no tenderness, palpable spasm or pain on motion Neurological - alert, oriented, normal speech, no focal findings or movement disorder noted Musculoskeletal - no joint tenderness, deformity or swelling Extremities - pedal edema 1 + B/L LE, intact peripheral pulses Skin - normal coloration and turgor, no rashes, no suspicious skin lesions noted  SVE: Dilation: 1.5 Effacement  (%): 40 Cervical Position: Posterior Station: -3 Presentation: Vertex Exam by:: Arnell Asal RNC  Spec exam: not performed FHT: 155, mod variability, + accels, no decels Ctx: q67min  Prenatal labs: ABO, Rh:  B positive Antibody:  Neg Rubella:  Imm RPR:   NR HBsAg: Neg  HIV:   Neg GBS:   Negative Hgb/Plt:  11.6 / 201 1hr GTT: 91 (normal) Quad screen: Negative  Labs:  No results found for this or any previous visit (from the past 24 hour(s)).    Prenatal Transfer Tool  Maternal Diabetes: No Genetic Screening: Normal Maternal Ultrasounds/Referrals: Abnormal:  Findings:   Other: Please see prenatal record for details--Polyhydramnios Fetal Ultrasounds or other Referrals:  Fetal echo Maternal Substance Abuse:  No Significant Maternal Medications:  None Significant Maternal Lab Results: None GBS: Negative (08/19/11)  A/P: 31 year old [id number] @ [redacted]w[redacted]d presents with IOL 2/2 size > dates (polyhydramnios) 1. Admit to L&D 2. Expectant management, anticipate vaginal delivery 3. Antibiotics, none 4. Continuous toco/FHT 5. BTL papers signed

## 2011-09-11 ENCOUNTER — Encounter (HOSPITAL_COMMUNITY): Payer: Self-pay | Admitting: Anesthesiology

## 2011-09-11 ENCOUNTER — Encounter (HOSPITAL_COMMUNITY): Payer: Self-pay

## 2011-09-11 ENCOUNTER — Inpatient Hospital Stay (HOSPITAL_COMMUNITY): Payer: Medicaid Other | Admitting: Anesthesiology

## 2011-09-11 ENCOUNTER — Encounter (HOSPITAL_COMMUNITY)
Admission: RE | Disposition: A | Discharge status: Home / Self Care | Payer: Self-pay | Source: Ambulatory Visit | Attending: Obstetrics & Gynecology

## 2011-09-11 DIAGNOSIS — Z302 Encounter for sterilization: Secondary | ICD-10-CM

## 2011-09-11 DIAGNOSIS — O409XX0 Polyhydramnios, unspecified trimester, not applicable or unspecified: Secondary | ICD-10-CM

## 2011-09-11 HISTORY — PX: TUBAL LIGATION: SHX77

## 2011-09-11 LAB — RPR: RPR Ser Ql: NONREACTIVE

## 2011-09-11 SURGERY — LIGATION, FALLOPIAN TUBE, POSTPARTUM
Anesthesia: Epidural | Site: Abdomen | Laterality: Bilateral | Wound class: Clean

## 2011-09-11 MED ORDER — EPHEDRINE 5 MG/ML INJ: 10.0000 mg | INTRAVENOUS | Status: DC | PRN

## 2011-09-11 MED ORDER — FENTANYL CITRATE 0.05 MG/ML IJ SOLN
INTRAMUSCULAR | Status: AC
  Filled 2011-09-11: qty 2

## 2011-09-11 MED ORDER — KETOROLAC TROMETHAMINE 30 MG/ML IJ SOLN: 15.0000 mg | Freq: Once | INTRAMUSCULAR | Status: DC | PRN

## 2011-09-11 MED ORDER — METHYLERGONOVINE MALEATE 0.2 MG/ML IJ SOLN: 0.2000 mg | INTRAMUSCULAR | Status: DC | PRN

## 2011-09-11 MED ORDER — ZOLPIDEM TARTRATE 5 MG PO TABS: 5.0000 mg | ORAL_TABLET | Freq: Every evening | ORAL | Status: DC | PRN

## 2011-09-11 MED ORDER — OXYTOCIN 40 UNITS IN LACTATED RINGERS INFUSION - SIMPLE MED
1.0000 m[IU]/min | INTRAVENOUS | Status: DC
  Administered 2011-09-11: 2 m[IU]/min via INTRAVENOUS
  Administered 2011-09-11: 10 m[IU]/min via INTRAVENOUS

## 2011-09-11 MED ORDER — FENTANYL CITRATE 0.05 MG/ML IJ SOLN
INTRAMUSCULAR | Status: DC | PRN
  Administered 2011-09-11 (×2): 50 ug via INTRAVENOUS

## 2011-09-11 MED ORDER — DIPHENHYDRAMINE HCL 25 MG PO CAPS: 25.0000 mg | ORAL_CAPSULE | Freq: Four times a day (QID) | ORAL | Status: DC | PRN

## 2011-09-11 MED ORDER — WITCH HAZEL-GLYCERIN EX PADS: 1.0000 "application " | MEDICATED_PAD | CUTANEOUS | Status: DC | PRN

## 2011-09-11 MED ORDER — BENZOCAINE-MENTHOL 20-0.5 % EX AERO: 1.0000 "application " | INHALATION_SPRAY | CUTANEOUS | Status: DC | PRN

## 2011-09-11 MED ORDER — SIMETHICONE 80 MG PO CHEW: 80.0000 mg | CHEWABLE_TABLET | ORAL | Status: DC | PRN

## 2011-09-11 MED ORDER — OXYCODONE-ACETAMINOPHEN 5-325 MG PO TABS: 1.0000 | ORAL_TABLET | ORAL | Status: DC | PRN

## 2011-09-11 MED ORDER — LACTATED RINGERS IV SOLN
500.0000 mL | Freq: Once | INTRAVENOUS | Status: AC
  Administered 2011-09-11: 500 mL via INTRAVENOUS

## 2011-09-11 MED ORDER — SENNOSIDES-DOCUSATE SODIUM 8.6-50 MG PO TABS
2.0000 | ORAL_TABLET | Freq: Every day | ORAL | Status: DC
  Administered 2011-09-11 – 2011-09-12 (×2): 2 via ORAL

## 2011-09-11 MED ORDER — LIDOCAINE HCL (PF) 1 % IJ SOLN
INTRAMUSCULAR | Status: DC | PRN
Start: 2011-09-11 — End: 2011-09-11
  Administered 2011-09-11 (×2): 5 mL

## 2011-09-11 MED ORDER — FENTANYL 2.5 MCG/ML BUPIVACAINE 1/10 % EPIDURAL INFUSION (WH - ANES)
INTRAMUSCULAR | Status: AC
  Administered 2011-09-11: 14 mL/h via EPIDURAL
  Filled 2011-09-11: qty 60

## 2011-09-11 MED ORDER — PHENYLEPHRINE 40 MCG/ML (10ML) SYRINGE FOR IV PUSH (FOR BLOOD PRESSURE SUPPORT): 80.0000 ug | PREFILLED_SYRINGE | INTRAVENOUS | Status: DC | PRN

## 2011-09-11 MED ORDER — FERROUS SULFATE 325 (65 FE) MG PO TABS
325.0000 mg | ORAL_TABLET | Freq: Two times a day (BID) | ORAL | Status: DC
  Administered 2011-09-11 – 2011-09-13 (×4): 325 mg via ORAL
  Filled 2011-09-11 (×4): qty 1

## 2011-09-11 MED ORDER — DIPHENHYDRAMINE HCL 50 MG/ML IJ SOLN: 12.5000 mg | INTRAMUSCULAR | Status: DC | PRN

## 2011-09-11 MED ORDER — MIDAZOLAM HCL 2 MG/2ML IJ SOLN
INTRAMUSCULAR | Status: AC
  Filled 2011-09-11: qty 2

## 2011-09-11 MED ORDER — IBUPROFEN 600 MG PO TABS
600.0000 mg | ORAL_TABLET | Freq: Four times a day (QID) | ORAL | Status: DC
  Administered 2011-09-11 – 2011-09-13 (×7): 600 mg via ORAL
  Filled 2011-09-11 (×8): qty 1

## 2011-09-11 MED ORDER — LANOLIN HYDROUS EX OINT: TOPICAL_OINTMENT | CUTANEOUS | Status: DC | PRN

## 2011-09-11 MED ORDER — MIDAZOLAM HCL 5 MG/5ML IJ SOLN
INTRAMUSCULAR | Status: DC | PRN
  Administered 2011-09-11: 2 mg via INTRAVENOUS

## 2011-09-11 MED ORDER — TETANUS-DIPHTH-ACELL PERTUSSIS 5-2.5-18.5 LF-MCG/0.5 IM SUSP: 0.5000 mL | Freq: Once | INTRAMUSCULAR | Status: DC

## 2011-09-11 MED ORDER — MEPERIDINE HCL 25 MG/ML IJ SOLN: 6.2500 mg | INTRAMUSCULAR | Status: DC | PRN

## 2011-09-11 MED ORDER — MEASLES, MUMPS & RUBELLA VAC ~~LOC~~ INJ: 0.5000 mL | INJECTION | Freq: Once | SUBCUTANEOUS | Status: DC

## 2011-09-11 MED ORDER — PROMETHAZINE HCL 25 MG/ML IJ SOLN: 6.2500 mg | INTRAMUSCULAR | Status: DC | PRN

## 2011-09-11 MED ORDER — BUPIVACAINE HCL (PF) 0.25 % IJ SOLN
INTRAMUSCULAR | Status: AC
  Filled 2011-09-11: qty 30

## 2011-09-11 MED ORDER — PRENATAL MULTIVITAMIN CH: 1.0000 | ORAL_TABLET | Freq: Every day | ORAL | Status: DC

## 2011-09-11 MED ORDER — TERBUTALINE SULFATE 1 MG/ML IJ SOLN: 0.2500 mg | Freq: Once | INTRAMUSCULAR | Status: DC | PRN

## 2011-09-11 MED ORDER — DIBUCAINE 1 % RE OINT
1.0000 "application " | TOPICAL_OINTMENT | RECTAL | Status: DC | PRN
  Administered 2011-09-12: 1 via RECTAL
  Filled 2011-09-11: qty 28

## 2011-09-11 MED ORDER — MAGNESIUM HYDROXIDE 400 MG/5ML PO SUSP: 30.0000 mL | ORAL | Status: DC | PRN

## 2011-09-11 MED ORDER — SODIUM BICARBONATE 8.4 % IV SOLN
INTRAVENOUS | Status: DC | PRN
  Administered 2011-09-11: 5 mL via EPIDURAL

## 2011-09-11 MED ORDER — EPHEDRINE 5 MG/ML INJ
INTRAVENOUS | Status: AC
  Filled 2011-09-11: qty 4

## 2011-09-11 MED ORDER — BUPIVACAINE HCL (PF) 0.25 % IJ SOLN
INTRAMUSCULAR | Status: DC | PRN
  Administered 2011-09-11: 10 mL

## 2011-09-11 MED ORDER — OXYCODONE-ACETAMINOPHEN 5-325 MG PO TABS
1.0000 | ORAL_TABLET | ORAL | Status: DC | PRN
  Administered 2011-09-11 – 2011-09-12 (×6): 1 via ORAL
  Filled 2011-09-11 (×6): qty 1

## 2011-09-11 MED ORDER — PRENATAL MULTIVITAMIN CH
1.0000 | ORAL_TABLET | Freq: Every day | ORAL | Status: DC
  Administered 2011-09-11 – 2011-09-13 (×3): 1 via ORAL
  Filled 2011-09-11 (×3): qty 1

## 2011-09-11 MED ORDER — FENTANYL 2.5 MCG/ML BUPIVACAINE 1/10 % EPIDURAL INFUSION (WH - ANES)
14.0000 mL/h | INTRAMUSCULAR | Status: DC
  Administered 2011-09-11 (×2): 14 mL/h via EPIDURAL
  Filled 2011-09-11: qty 60

## 2011-09-11 MED ORDER — DIBUCAINE 1 % RE OINT: 1.0000 "application " | TOPICAL_OINTMENT | RECTAL | Status: DC | PRN

## 2011-09-11 MED ORDER — HYDROMORPHONE HCL PF 1 MG/ML IJ SOLN: 0.2500 mg | INTRAMUSCULAR | Status: DC | PRN

## 2011-09-11 MED ORDER — ONDANSETRON HCL 4 MG/2ML IJ SOLN: 4.0000 mg | INTRAMUSCULAR | Status: DC | PRN

## 2011-09-11 MED ORDER — METHYLERGONOVINE MALEATE 0.2 MG PO TABS: 0.2000 mg | ORAL_TABLET | ORAL | Status: DC | PRN

## 2011-09-11 MED ORDER — SODIUM CHLORIDE 0.9 % IR SOLN
Status: DC | PRN
  Administered 2011-09-11: 1000 mL

## 2011-09-11 MED ORDER — LIDOCAINE-EPINEPHRINE (PF) 2 %-1:200000 IJ SOLN
INTRAMUSCULAR | Status: AC
  Filled 2011-09-11: qty 20

## 2011-09-11 MED ORDER — ONDANSETRON HCL 4 MG PO TABS: 4.0000 mg | ORAL_TABLET | ORAL | Status: DC | PRN

## 2011-09-11 MED ORDER — IBUPROFEN 600 MG PO TABS: 600.0000 mg | ORAL_TABLET | Freq: Four times a day (QID) | ORAL | Status: DC

## 2011-09-11 MED ORDER — PHENYLEPHRINE 40 MCG/ML (10ML) SYRINGE FOR IV PUSH (FOR BLOOD PRESSURE SUPPORT)
PREFILLED_SYRINGE | INTRAVENOUS | Status: AC
  Filled 2011-09-11: qty 5

## 2011-09-11 MED ORDER — SENNOSIDES-DOCUSATE SODIUM 8.6-50 MG PO TABS: 2.0000 | ORAL_TABLET | Freq: Every day | ORAL | Status: DC

## 2011-09-11 MED ORDER — TETANUS-DIPHTH-ACELL PERTUSSIS 5-2.5-18.5 LF-MCG/0.5 IM SUSP
0.5000 mL | Freq: Once | INTRAMUSCULAR | Status: AC
  Administered 2011-09-12: 0.5 mL via INTRAMUSCULAR
  Filled 2011-09-11: qty 0.5

## 2011-09-11 SURGICAL SUPPLY — 20 items
CHLORAPREP W/TINT 26ML (MISCELLANEOUS) ×2 IMPLANT
CLIP FILSHIE TUBAL LIGA STRL (Clip) ×2 IMPLANT
CLOTH BEACON ORANGE TIMEOUT ST (SAFETY) ×2 IMPLANT
DRSG COVADERM PLUS 2X2 (GAUZE/BANDAGES/DRESSINGS) ×2 IMPLANT
GLOVE BIOGEL PI IND STRL 7.0 (GLOVE) ×2 IMPLANT
GLOVE BIOGEL PI INDICATOR 7.0 (GLOVE) ×2
GLOVE ECLIPSE 7.0 STRL STRAW (GLOVE) ×2 IMPLANT
GOWN PREVENTION PLUS LG XLONG (DISPOSABLE) ×2 IMPLANT
GOWN PREVENTION PLUS XLARGE (GOWN DISPOSABLE) ×2 IMPLANT
NEEDLE HYPO 22GX1.5 SAFETY (NEEDLE) ×2 IMPLANT
NS IRRIG 1000ML POUR BTL (IV SOLUTION) ×2 IMPLANT
PACK ABDOMINAL MINOR (CUSTOM PROCEDURE TRAY) ×2 IMPLANT
SPONGE LAP 4X18 X RAY DECT (DISPOSABLE) ×2 IMPLANT
SUT VIC AB 0 CT1 27 (SUTURE) ×2
SUT VIC AB 0 CT1 27XBRD ANBCTR (SUTURE) ×1 IMPLANT
SUT VICRYL 4-0 PS2 18IN ABS (SUTURE) ×2 IMPLANT
SYR CONTROL 10ML LL (SYRINGE) ×2 IMPLANT
TOWEL OR 17X24 6PK STRL BLUE (TOWEL DISPOSABLE) ×4 IMPLANT
TRAY FOLEY CATH 14FR (SET/KITS/TRAYS/PACK) ×2 IMPLANT
WATER STERILE IRR 1000ML POUR (IV SOLUTION) IMPLANT

## 2011-09-11 NOTE — Anesthesia Postprocedure Evaluation (Signed)
  Anesthesia Post-op Note  Patient: Diana Liu  Procedure(s) Performed: Procedure(s) (LRB): POST PARTUM TUBAL LIGATION (Bilateral)  Patient Location: PACU and Mother/Baby  Anesthesia Type: Epidural  Level of Consciousness: awake, alert  and oriented  Airway and Oxygen Therapy: Patient Spontanous Breathing    Post-op Assessment: Patient's Cardiovascular Status Stable and Respiratory Function Stable  Post-op Vital Signs: stable  Complications: No apparent anesthesia complications

## 2011-09-11 NOTE — Anesthesia Preprocedure Evaluation (Signed)
Anesthesia Evaluation  Patient identified by MRN, date of birth, ID band Patient awake    Reviewed: Allergy & Precautions, H&P , Patient's Chart, lab work & pertinent test results  Airway Mallampati: II TM Distance: >3 FB Neck ROM: full    Dental No notable dental hx.    Pulmonary neg pulmonary ROS,  breath sounds clear to auscultation  Pulmonary exam normal       Cardiovascular negative cardio ROS  Rhythm:regular Rate:Normal     Neuro/Psych negative neurological ROS  negative psych ROS   GI/Hepatic negative GI ROS, Neg liver ROS,   Endo/Other  negative endocrine ROS  Renal/GU negative Renal ROS     Musculoskeletal   Abdominal   Peds  Hematology negative hematology ROS (+)   Anesthesia Other Findings   Reproductive/Obstetrics (+) Pregnancy                           Anesthesia Physical  Anesthesia Plan  ASA: II  Anesthesia Plan: Epidural   Post-op Pain Management:    Induction:   Airway Management Planned:   Additional Equipment:   Intra-op Plan:   Post-operative Plan:   Informed Consent: I have reviewed the patients History and Physical, chart, labs and discussed the procedure including the risks, benefits and alternatives for the proposed anesthesia with the patient or authorized representative who has indicated his/her understanding and acceptance.     Plan Discussed with:   Anesthesia Plan Comments: (Using epidural In - Situ for PPTL)        Anesthesia Quick Evaluation

## 2011-09-11 NOTE — H&P (View-Only) (Signed)
Pt puhed for about 30 minutes with not a lot of progress.  Decision made to labor down.  FHR 150's, avg LTV, + accels, occ mild variable decel.  Dr. Shawnie Pons in to asses.

## 2011-09-11 NOTE — Transfer of Care (Signed)
Immediate Anesthesia Transfer of Care Note  Patient: Diana Liu  Procedure(s) Performed: Procedure(s) (LRB): POST PARTUM TUBAL LIGATION (Bilateral)  Patient Location: PACU  Anesthesia Type: Epidural  Level of Consciousness: awake  Airway & Oxygen Therapy: Patient Spontanous Breathing  Post-op Assessment: Report given to PACU RN and Post -op Vital signs reviewed and stable  Post vital signs: stable  Complications: No apparent anesthesia complications

## 2011-09-11 NOTE — Progress Notes (Signed)
S: Pt doing well, resting comfortably and sleeping. Her foley bulb has fallen out approx. 20 min ago.  O: Afebrile, VSS SVE: 3 / 60 / -3 FHT: 125 baseline with moderate variability, positive accels, negative decels--Category I Ctx: q4-8 min Membranes: intact  A/P: [id number] in active labor 1. Continuous toco/FHT 2. Meds: nubain, begin pitocin  3. Anticipate vaginal delivery  4. Epidural when ready  I have seen this patient and agree with the above medical student's note.  LEFTWICH-KIRBY, Dasha Kawabata Certified Nurse-Midwife

## 2011-09-11 NOTE — Anesthesia Postprocedure Evaluation (Signed)
Anesthesia Post Note  Patient: Diana Liu  Procedure(s) Performed: Procedure(s) (LRB): POST PARTUM TUBAL LIGATION (Bilateral)  Anesthesia type: Epidural  Patient location: PACU  Post pain: Pain level controlled  Post assessment: Post-op Vital signs reviewed  Last Vitals:  Filed Vitals:   09/11/11 1315  BP: 115/64  Pulse: 88  Temp:   Resp: 16    Post vital signs: Reviewed  Level of consciousness: awake  Complications: No apparent anesthesia complications

## 2011-09-11 NOTE — Progress Notes (Signed)
Positioned by Anesthesia for epidural sitting, Strip tracing maternal at this time.

## 2011-09-11 NOTE — Anesthesia Preprocedure Evaluation (Signed)

## 2011-09-11 NOTE — Op Note (Signed)
Diana Liu 09/10/2011 - 09/11/2011  PREOPERATIVE DIAGNOSIS:  Multiparity, undesired fertility  POSTOPERATIVE DIAGNOSIS:  Multiparity, undesired fertility  PROCEDURE:  Postpartum Bilateral Tubal Sterilization using Filshie Clips   ANESTHESIA:  Epidural and local analgesia using 0.25% Marcaine  COMPLICATIONS:  None immediate.  ESTIMATED BLOOD LOSS: 25 ml.  INDICATIONS: 31 y.o. [id number]  with undesired fertility,status post vaginal delivery, desires permanent sterilization.  Other reversible forms of contraception were discussed with patient; she declines all other modalities. Risks of procedure discussed with patient including but not limited to: risk of regret, permanence of method, bleeding, infection, injury to surrounding organs and need for additional procedures.  Failure risk of 0.5-1% with increased risk of ectopic gestation if pregnancy occurs was also discussed with patient.     FINDINGS:  Normal uterus, tubes, and ovaries.  PROCEDURE DETAILS: The patient was taken to the operating room where her spinal anesthesia was dosed up to surgical level and found to be adequate.  She was then placed in a supine position and prepped and draped in the usual sterile fashion.  After an adequate timeout was performed, attention was turned to the patient's abdomen where a small transverse skin incision was made under the umbilical fold. The incision was taken down to the layer of fascia using the scalpel, and fascia was incised, and extended bilaterally. The peritoneum was entered in a sharp fashion. The patient was placed in Trendelenburg. The right fallopian tube was identified and grasped with a Babcock clamp, and followed out to the fimbriated end.  A Filshie clip was placed on the left fallopian tube about 2 cm from the cornu.   A similar process was carried out on the left side allowing for bilateral tubal sterilization.  Good hemostasis was noted overall.  Local analgesia was injected into both  Filshie application sites.The instruments were then removed from the patient's abdomen and the fascial incision was repaired with 0 Vicryl, and the skin was closed with a 4-0 Vicryl subcuticular stitch. The patient tolerated the procedure well.  Sponge, lap, and needle counts were correct times two.  The patient was then taken to the recovery room awake, extubated and in stable condition.  Jahmier Willadsen S 09/11/2011 12:55 PM

## 2011-09-11 NOTE — OR Nursing (Signed)
Filshie clips applied to right and left fallopian tubes by Dr. Gildardo Griffes on  09/11/2011.  Lot [id number]. Expiration date-01/2014. Manufacture- [employer].

## 2011-09-11 NOTE — Anesthesia Procedure Notes (Signed)
Epidural Patient location during procedure: OB Start time: 09/11/2011 3:48 AM  Staffing Anesthesiologist: Brayton Caves R Performed by: anesthesiologist   Preanesthetic Checklist Completed: patient identified, site marked, surgical consent, pre-op evaluation, timeout performed, IV checked, risks and benefits discussed and monitors and equipment checked  Epidural Patient position: sitting Prep: site prepped and draped and DuraPrep Patient monitoring: continuous pulse ox and blood pressure Approach: midline Injection technique: LOR air and LOR saline  Needle:  Needle type: Tuohy  Needle gauge: 17 G Needle length: 9 cm Needle insertion depth: 5 cm cm Catheter type: closed end flexible Catheter size: 19 Gauge Catheter at skin depth: 10 cm Test dose: negative  Assessment Events: blood not aspirated, injection not painful, no injection resistance, negative IV test and no paresthesia  Additional Notes Patient identified.  Risk benefits discussed including failed block, incomplete pain control, headache, nerve damage, paralysis, blood pressure changes, nausea, vomiting, reactions to medication both toxic or allergic, and postpartum back pain.  Patient expressed understanding and wished to proceed.  All questions were answered.  Sterile technique used throughout procedure and epidural site dressed with sterile barrier dressing. No paresthesia or other complications noted.The patient did not experience any signs of intravascular injection such as tinnitus or metallic taste in mouth nor signs of intrathecal spread such as rapid motor block. Please see nursing notes for vital signs.

## 2011-09-11 NOTE — Progress Notes (Signed)
Bedpan offered unable to void at this time nor prior to Epidural Placement.

## 2011-09-11 NOTE — Progress Notes (Signed)
Pt puhed for about 30 minutes with not a lot of progress.  Decision made to labor down.  FHR 150's, avg LTV, + accels, occ mild variable decel.  Dr. Pratt in to asses. 

## 2011-09-11 NOTE — Addendum Note (Signed)
Addendum  created 09/11/11 2148 by Len Blalock, CRNA   Modules edited:Notes Section

## 2011-09-11 NOTE — Interval H&P Note (Signed)
History and Physical Interval Note:  09/11/2011 12:22 PM  Diana Liu  has presented today for surgery, with the diagnosis of desires sterilization  The various methods of treatment have been discussed with the patient and family. After consideration of risks, benefits and other options for treatment, the patient has consented to  Procedure(s) (LRB): POST PARTUM TUBAL LIGATION (Bilateral) as a surgical intervention .  The patient's history has been reviewed, patient examined, no change in status, stable for surgery.  I have reviewed the patients' chart and labs.  Questions were answered to the patient's satisfaction.  Patient counseled, r.e. Risks benefits of BTL, including permanency of procedure, risk of failure(1:100), increased risk of ectopic.  Patient verbalized understanding and desires to proceed.  PRATT,TANYA S

## 2011-09-11 NOTE — Interval H&P Note (Signed)
History and Physical Interval Note:  09/11/2011 12:24 PM  Diana Liu  has presented today for surgery, with the diagnosis of desires sterilization.  She is now s/p SVD of viable baby.  She desires permanent sterilization.The various methods of treatment have been discussed with the patient and family. After consideration of risks, benefits and other options for treatment, the patient has consented to  Procedure(s) (LRB): POST PARTUM TUBAL LIGATION (Bilateral) as a surgical intervention .  The patient's history has been reviewed, patient examined, no change in status, stable for surgery.  I have reviewed the patients' chart and labs.  Questions were answered to the patient's satisfaction.     Staceyann Knouff S

## 2011-09-11 NOTE — Progress Notes (Signed)
Diana Liu is a 31 y.o. [phone] at [redacted]w[redacted]d admitted for induction of labor due to polyhydramnios.  Subjective: Pt comfortable with epidural. S/O in room for support.  Objective: BP 105/51  Pulse 86  Temp 98.6 F (37 C) (Oral)  Resp 18  Ht 5\' 5"  (1.651 m)  Wt 93.895 kg (207 lb)  BMI 34.45 kg/m2  LMP 12/10/2010      FHT:  FHR: 135 bpm, variability: moderate,  accelerations:  Present,  decelerations:  Absent UC:   regular, every 4-5 minutes SVE:   Dilation: 6 Effacement (%): 50 Station: -2 Exam by:: Leftwick CNM AROM, clear fluid  Labs: Lab Results  Component Value Date   WBC 11.9* 09/10/2011   HGB 10.5* 09/10/2011   HCT 30.1* 09/10/2011   MCV 72.7* 09/10/2011   PLT 194 09/10/2011    Assessment / Plan: Induction of labor due to polyhydramnios,  progressing well on pitocin  Labor: Progressing normally Preeclampsia:  N/A Fetal Wellbeing:  Category I Pain Control:  Epidural I/D:  n/a Anticipated MOD:  NSVD  LEFTWICH-KIRBY, Gabreille Dardis 09/11/2011, 5:55 AM

## 2011-09-12 ENCOUNTER — Encounter (HOSPITAL_COMMUNITY)
Admission: RE | Disposition: A | Discharge status: Home / Self Care | Payer: Self-pay | Source: Ambulatory Visit | Attending: Obstetrics & Gynecology

## 2011-09-12 SURGERY — LIGATION, FALLOPIAN TUBE, POSTPARTUM
Anesthesia: Epidural | Laterality: Bilateral

## 2011-09-12 NOTE — Progress Notes (Signed)
Post Partum Day 1 Subjective: no complaints and some increased abdominal pain following BTL  Objective: Blood pressure 114/72, pulse 74, temperature 98.1 F (36.7 C), temperature source Oral, resp. rate 20, height 5\' 5"  (1.651 m), weight 93.895 kg (207 lb), last menstrual period 12/10/2010, SpO2 97.00%, unknown if currently breastfeeding.  Physical Exam:  General: alert, cooperative and appears stated age Lochia: appropriate Uterine Fundus: firm Incision: healing well DVT Evaluation: No evidence of DVT seen on physical exam.   Basename 09/10/11 2030  HGB 10.5*  HCT 30.1*    Assessment/Plan: Plan for discharge tomorrow and Contraception BTL Bottle feeding.  LOS: 2 days   PRATT,TANYA S 09/12/2011, 7:29 AM

## 2011-09-13 ENCOUNTER — Ambulatory Visit (HOSPITAL_COMMUNITY): Payer: Medicaid Other | Attending: Physician Assistant

## 2011-09-13 ENCOUNTER — Encounter (HOSPITAL_COMMUNITY): Payer: Self-pay | Admitting: Family Medicine

## 2011-09-13 MED ORDER — IBUPROFEN 600 MG PO TABS: 600.0000 mg | ORAL_TABLET | Freq: Four times a day (QID) | ORAL | Status: AC

## 2011-09-13 MED ORDER — OXYCODONE-ACETAMINOPHEN 5-325 MG PO TABS: 1.0000 | ORAL_TABLET | ORAL | Status: AC | PRN

## 2011-09-13 NOTE — Progress Notes (Signed)
Ur chart review completed.  

## 2011-09-13 NOTE — Discharge Summary (Signed)
Obstetric Discharge Summary Reason for Admission: induction of labor Prenatal Procedures: NST Intrapartum Procedures: spontaneous vaginal delivery Postpartum Procedures: P.P. tubal ligation Complications-Operative and Postpartum: none Hemoglobin  Date Value Range Status  09/10/2011 10.5* 12.0 - 15.0 g/dL Final     HCT  Date Value Range Status  09/10/2011 30.1* 36.0 - 46.0 % Final   Hospital Course: Presented for IOL.  Proceeded to vaginal delivery with no complications. Had elective BTL after delivery. Postpartum course uneventful.   Physical Exam:  General: alert and no distress Lochia: appropriate Uterine Fundus: firm Incision: healing well, no significant drainage DVT Evaluation: No evidence of DVT seen on physical exam.  Discharge Diagnoses: Term Pregnancy-delivered and Undesired fertility/BTL  Discharge Information: Date: 09/13/2011 Activity: unrestricted and pelvic rest Diet: routine Medications: PNV, Ibuprofen and Percocet Condition: stable Instructions: refer to practice specific booklet Discharge to: home Follow-up Information    Follow up with [facility]. Schedule an appointment as soon as possible for a visit in 6 weeks.         Newborn Data: Live born female  Birth Weight: 9 lb 9.8 oz (4360 g) APGAR: 8, 9  Home with mother.  [facility] 09/13/2011, 6:34 AM

## 2013-05-09 ENCOUNTER — Emergency Department (INDEPENDENT_AMBULATORY_CARE_PROVIDER_SITE_OTHER): Payer: Self-pay

## 2013-05-09 ENCOUNTER — Emergency Department (INDEPENDENT_AMBULATORY_CARE_PROVIDER_SITE_OTHER)
Admission: EM | Admit: 2013-05-09 | Discharge: 2013-05-09 | Disposition: A | Discharge status: Home / Self Care | Payer: Self-pay | Source: Home / Self Care | Attending: Family Medicine | Admitting: Family Medicine

## 2013-05-09 ENCOUNTER — Encounter (HOSPITAL_COMMUNITY): Payer: Self-pay | Admitting: Emergency Medicine

## 2013-05-09 DIAGNOSIS — W458XXA Other foreign body or object entering through skin, initial encounter: Secondary | ICD-10-CM

## 2013-05-09 DIAGNOSIS — S91309A Unspecified open wound, unspecified foot, initial encounter: Secondary | ICD-10-CM

## 2013-05-09 DIAGNOSIS — S91319A Laceration without foreign body, unspecified foot, initial encounter: Secondary | ICD-10-CM

## 2013-05-09 DIAGNOSIS — T148XXA Other injury of unspecified body region, initial encounter: Secondary | ICD-10-CM

## 2013-05-09 MED ORDER — SULFAMETHOXAZOLE-TRIMETHOPRIM 800-160 MG PO TABS: 2.0000 | ORAL_TABLET | Freq: Two times a day (BID) | ORAL | Status: DC

## 2013-05-09 NOTE — ED Notes (Signed)
Pt    States   Stepped   On  An  ornement  Last        Night  She  Removed  sev  Pieces     From the  Affected  Foot            She  Thinks  May  Be  Some  Pieces  Left

## 2013-05-09 NOTE — Discharge Instructions (Signed)
Thank you for coming in today. Take the antibiotic twice daily for 7 days.  Keep the wound covered with ointment.  Use the post op shoe as needed,  Return to work on Monday.  Return in 7-10 days to have the stitches removed.  Laceration Care, Adult A laceration is a cut or lesion that goes through all layers of the skin and into the tissue just beneath the skin. TREATMENT  Some lacerations may not require closure. Some lacerations may not be able to be closed due to an increased risk of infection. It is important to see your caregiver as soon as possible after an injury to minimize the risk of infection and maximize the opportunity for successful closure. If closure is appropriate, pain medicines may be given, if needed. The wound will be cleaned to help prevent infection. Your caregiver will use stitches (sutures), staples, wound glue (adhesive), or skin adhesive strips to repair the laceration. These tools bring the skin edges together to allow for faster healing and a better cosmetic outcome. However, all wounds will heal with a scar. Once the wound has healed, scarring can be minimized by covering the wound with sunscreen during the day for 1 full year. HOME CARE INSTRUCTIONS  For sutures or staples:  Keep the wound clean and dry.  If you were given a bandage (dressing), you should change it at least once a day. Also, change the dressing if it becomes wet or dirty, or as directed by your caregiver.  Wash the wound with soap and water 2 times a day. Rinse the wound off with water to remove all soap. Pat the wound dry with a clean towel.  After cleaning, apply a thin layer of the antibiotic ointment as recommended by your caregiver. This will help prevent infection and keep the dressing from sticking.  You may shower as usual after the first 24 hours. Do not soak the wound in water until the sutures are removed.  Only take over-the-counter or prescription medicines for pain, discomfort, or  fever as directed by your caregiver.  Get your sutures or staples removed as directed by your caregiver. For skin adhesive strips:  Keep the wound clean and dry.  Do not get the skin adhesive strips wet. You may bathe carefully, using caution to keep the wound dry.  If the wound gets wet, pat it dry with a clean towel.  Skin adhesive strips will fall off on their own. You may trim the strips as the wound heals. Do not remove skin adhesive strips that are still stuck to the wound. They will fall off in time. For wound adhesive:  You may briefly wet your wound in the shower or bath. Do not soak or scrub the wound. Do not swim. Avoid periods of heavy perspiration until the skin adhesive has fallen off on its own. After showering or bathing, gently pat the wound dry with a clean towel.  Do not apply liquid medicine, cream medicine, or ointment medicine to your wound while the skin adhesive is in place. This may loosen the film before your wound is healed.  If a dressing is placed over the wound, be careful not to apply tape directly over the skin adhesive. This may cause the adhesive to be pulled off before the wound is healed.  Avoid prolonged exposure to sunlight or tanning lamps while the skin adhesive is in place. Exposure to ultraviolet light in the first year will darken the scar.  The skin adhesive will usually  remain in place for 5 to 10 days, then naturally fall off the skin. Do not pick at the adhesive film. You may need a tetanus shot if:  You cannot remember when you had your last tetanus shot.  You have never had a tetanus shot. If you get a tetanus shot, your arm may swell, get red, and feel warm to the touch. This is common and not a problem. If you need a tetanus shot and you choose not to have one, there is a rare chance of getting tetanus. Sickness from tetanus can be serious. SEEK MEDICAL CARE IF:   You have redness, swelling, or increasing pain in the wound.  You see  a red line that goes away from the wound.  You have yellowish-white fluid (pus) coming from the wound.  You have a fever.  You notice a bad smell coming from the wound or dressing.  Your wound breaks open before or after sutures have been removed.  You notice something coming out of the wound such as wood or glass.  Your wound is on your hand or foot and you cannot move a finger or toe. SEEK IMMEDIATE MEDICAL CARE IF:   Your pain is not controlled with prescribed medicine.  You have severe swelling around the wound causing pain and numbness or a change in color in your arm, hand, leg, or foot.  Your wound splits open and starts bleeding.  You have worsening numbness, weakness, or loss of function of any joint around or beyond the wound.  You develop painful lumps near the wound or on the skin anywhere on your body. MAKE SURE YOU:   Understand these instructions.  Will watch your condition.  Will get help right away if you are not doing well or get worse. Document Released: 02/15/2005 Document Revised: 05/10/2011 Document Reviewed: 08/11/2010 [facility] LPExitCare Patient Information 2014 [employer], [place].

## 2013-05-09 NOTE — ED Provider Notes (Signed)
Diana Liu is a 33 y.o. female who presents to Urgent Care today for left planter foot laceration.  Patient stepped onto a glass ornament that her children had left on the ground. This resulted in a laceration to her plantar foot. She removed some pieces of glass but thinks there is still some there. She denies any radiating pain, weakness or numbness. The injury occurred aproximently 10 hours prior to presentation. No medications or treatment tried. Symptoms are severe and worse with ambulation.    Past Medical History  Diagnosis Date  . Sickle cell trait   . Abnormal Pap smear    History  Substance Use Topics  . Smoking status: Former Smoker -- 3 years    Types: Cigarettes  . Smokeless tobacco: Not on file  . Alcohol Use: No   history of bilateral tubal ligation ROS as above Medications: No current facility-administered medications for this encounter.   Current Outpatient Prescriptions  Medication Sig Dispense Refill  . sulfamethoxazole-trimethoprim (SEPTRA DS) 800-160 MG per tablet Take 2 tablets by mouth 2 (two) times daily.  28 tablet  0    Exam:  BP 119/77  Pulse 80  Temp(Src) 98.2 F (36.8 C) (Oral)  Resp 16  SpO2 100%  LMP 05/08/2013  Breastfeeding? No Gen: Well NAD Left foot: 2 cm curved laceration of the plantar skin under the proximal 5th metatarsal head.  Palpable glass in the wound.  A small 3mm shard of red glass was removed.  No further glass was palpated or visible with close inspection.    LACERATION repair.  Consent obtained and timeout performed.  Foot soaked in a water/betadine solution.  Skin cleaned with betadine. 3ml of 2% lidocaine with epinephrine was injected.  The wound was copiously irrigated.  The wound was explored and glass removed as noted above.  The skin was again cleaned with Betadine and the foot was prepped and draped.  3 horizontal mattress sutures using 3-0 Proline were used to close the wound.  Patient tolerated procedure  well   No results found for this or any previous visit (from the past 24 hour(s)). Dg Foot 2 Views Left  05/09/2013   CLINICAL DATA Pain post trauma  EXAM LEFT FOOT -2 VIEW  COMPARISON None.  FINDINGS Frontal and lateral views were obtained. There is a radiopaque foreign body measuring 5 mm in length just lateral and slightly volar to the proximal diaphysis of the fifth metatarsal. No other radiopaque foreign body seen. No fracture or dislocation. Joint spaces appear intact. No erosive change.  IMPRESSION Radiopaque foreign body lateral and volar to the proximal fifth metatarsal. No fracture or dislocation. No erosive change or bony destruction.  SIGNATURE  Electronically Signed   By: Bretta BangWilliam  Woodruff M.D.   On: 05/09/2013 10:26    Assessment and Plan: 33 y.o. female with foot laceration and foreign body removal.  Patient placed on empiric antibiotics due to delayed wound closure.  Followup in 7-10 days for suture removal.  Post op shoe for comfort.  Work note provided.    Discussed warning signs or symptoms. Please see discharge instructions. Patient expresses understanding.    Diana BongEvan S Vardaan Depascale, MD 05/09/13 1120

## 2013-05-09 NOTE — ED Notes (Signed)
Wound care  Med  Female  Ortho  shoe

## 2013-12-31 ENCOUNTER — Encounter (HOSPITAL_COMMUNITY): Payer: Self-pay | Admitting: Emergency Medicine

## 2014-01-30 ENCOUNTER — Encounter (HOSPITAL_COMMUNITY): Payer: Self-pay

## 2014-01-30 ENCOUNTER — Emergency Department (INDEPENDENT_AMBULATORY_CARE_PROVIDER_SITE_OTHER)
Admission: EM | Admit: 2014-01-30 | Discharge: 2014-01-30 | Disposition: A | Discharge status: Other Acute Inpatient Hospital | Payer: Self-pay | Source: Home / Self Care | Attending: Emergency Medicine | Admitting: Emergency Medicine

## 2014-01-30 ENCOUNTER — Encounter (HOSPITAL_COMMUNITY): Payer: Self-pay | Admitting: Family Medicine

## 2014-01-30 ENCOUNTER — Emergency Department (HOSPITAL_COMMUNITY)
Admission: EM | Admit: 2014-01-30 | Discharge: 2014-01-30 | Disposition: A | Discharge status: Home / Self Care | Payer: Self-pay | Attending: Emergency Medicine | Admitting: Emergency Medicine

## 2014-01-30 DIAGNOSIS — Z87891 Personal history of nicotine dependence: Secondary | ICD-10-CM | POA: Insufficient documentation

## 2014-01-30 DIAGNOSIS — R0789 Other chest pain: Secondary | ICD-10-CM

## 2014-01-30 DIAGNOSIS — R51 Headache: Secondary | ICD-10-CM | POA: Insufficient documentation

## 2014-01-30 DIAGNOSIS — R519 Headache, unspecified: Secondary | ICD-10-CM

## 2014-01-30 DIAGNOSIS — Z792 Long term (current) use of antibiotics: Secondary | ICD-10-CM | POA: Insufficient documentation

## 2014-01-30 DIAGNOSIS — R202 Paresthesia of skin: Secondary | ICD-10-CM

## 2014-01-30 DIAGNOSIS — L0291 Cutaneous abscess, unspecified: Secondary | ICD-10-CM

## 2014-01-30 DIAGNOSIS — Z862 Personal history of diseases of the blood and blood-forming organs and certain disorders involving the immune mechanism: Secondary | ICD-10-CM | POA: Insufficient documentation

## 2014-01-30 MED ORDER — KETOROLAC TROMETHAMINE 30 MG/ML IJ SOLN
30.0000 mg | Freq: Once | INTRAMUSCULAR | Status: AC
  Administered 2014-01-30: 30 mg via INTRAVENOUS
  Filled 2014-01-30: qty 1

## 2014-01-30 MED ORDER — KETOROLAC TROMETHAMINE 30 MG/ML IJ SOLN
INTRAMUSCULAR | Status: AC
  Filled 2014-01-30: qty 1

## 2014-01-30 MED ORDER — HYDROCODONE-ACETAMINOPHEN 5-325 MG PO TABS
2.0000 | ORAL_TABLET | Freq: Once | ORAL | Status: AC
  Administered 2014-01-30: 2 via ORAL

## 2014-01-30 MED ORDER — HYDROCODONE-ACETAMINOPHEN 5-325 MG PO TABS
1.0000 | ORAL_TABLET | Freq: Four times a day (QID) | ORAL | Status: DC | PRN
Start: 2014-01-30 — End: 2023-07-28

## 2014-01-30 MED ORDER — SULFAMETHOXAZOLE-TRIMETHOPRIM 800-160 MG PO TABS: 2.0000 | ORAL_TABLET | Freq: Two times a day (BID) | ORAL | Status: DC

## 2014-01-30 MED ORDER — METOCLOPRAMIDE HCL 5 MG/ML IJ SOLN
10.0000 mg | Freq: Once | INTRAMUSCULAR | Status: AC
  Administered 2014-01-30: 10 mg via INTRAVENOUS

## 2014-01-30 MED ORDER — METOCLOPRAMIDE HCL 5 MG/ML IJ SOLN
10.0000 mg | Freq: Once | INTRAMUSCULAR | Status: DC
  Filled 2014-01-30: qty 2

## 2014-01-30 MED ORDER — HYDROCODONE-ACETAMINOPHEN 5-325 MG PO TABS
ORAL_TABLET | ORAL | Status: AC
  Filled 2014-01-30: qty 2

## 2014-01-30 MED ORDER — LIDOCAINE-EPINEPHRINE (PF) 2 %-1:200000 IJ SOLN
INTRAMUSCULAR | Status: AC
  Filled 2014-01-30: qty 20

## 2014-01-30 MED ORDER — DIPHENHYDRAMINE HCL 50 MG/ML IJ SOLN
50.0000 mg | Freq: Once | INTRAMUSCULAR | Status: AC
  Administered 2014-01-30: 50 mg via INTRAVENOUS
  Filled 2014-01-30: qty 1

## 2014-01-30 NOTE — ED Provider Notes (Signed)
CSN: [id number]     Arrival date & time 01/30/14  1339 History   First MD Initiated Contact with Patient 01/30/14 1356     Chief Complaint  Patient presents with  . Headache     (Consider location/radiation/quality/duration/timing/severity/associated sxs/prior Treatment) Patient is a 33 y.o. female presenting with headaches. The history is provided by the patient (the pt complains of a headache). No language interpreter was used.  Headache Pain location:  Generalized Quality:  Dull Radiates to:  Does not radiate Severity currently:  4/10 Severity at highest:  6/10 Onset quality:  Sudden Timing:  Constant Associated symptoms: no abdominal pain, no back pain, no congestion, no cough, no diarrhea, no fatigue, no seizures and no sinus pressure     Past Medical History  Diagnosis Date  . Sickle cell trait   . Abnormal Pap smear    Past Surgical History  Procedure Laterality Date  . Dilation and curettage of uterus    . Tubal ligation  09/11/2011    Procedure: POST PARTUM TUBAL LIGATION;  Surgeon: Reva Boresanya S Pratt, MD;  Location: [facility];  Service: Gynecology;  Laterality: Bilateral;  Bilateral post partum tubal ligation with filshie clips.   Family History  Problem Relation Age of Onset  . Hypertension Mother   . Diabetes Mother   . Anesthesia problems Neg Hx    History  Substance Use Topics  . Smoking status: Former Smoker -- 3 years    Types: Cigarettes  . Smokeless tobacco: Not on file  . Alcohol Use: No   OB History    Gravida Para Term Preterm AB TAB SAB Ectopic Multiple Living   6 4 4  2 1 1   4      Review of Systems  Constitutional: Negative for appetite change and fatigue.  HENT: Negative for congestion, ear discharge and sinus pressure.   Eyes: Negative for discharge.  Respiratory: Negative for cough.   Cardiovascular: Negative for chest pain.  Gastrointestinal: Negative for abdominal pain and diarrhea.  Genitourinary: Negative for frequency and hematuria.   Musculoskeletal: Negative for back pain.  Skin: Negative for rash.  Neurological: Positive for headaches. Negative for seizures.  Psychiatric/Behavioral: Negative for hallucinations.      Allergies  Review of patient's allergies indicates no known allergies.  Home Medications   Prior to Admission medications   Medication Sig Start Date End Date Taking? Authorizing Provider  HYDROcodone-acetaminophen (NORCO/VICODIN) 5-325 MG per tablet Take 1 tablet by mouth every 6 (six) hours as needed for moderate pain. 01/30/14   Benny LennertJoseph L Nyshaun Standage, MD  sulfamethoxazole-trimethoprim (BACTRIM DS,SEPTRA DS) 800-160 MG per tablet Take 2 tablets by mouth 2 (two) times daily. Patient not taking: Reported on 01/30/2014 01/30/14   Reuben Likesavid C Keller, MD   BP 102/54 mmHg  Pulse 61  Temp(Src) 98.6 F (37 C)  Resp 18  SpO2 98%  LMP 01/19/2014 (Exact Date) Physical Exam  Constitutional: She is oriented to person, place, and time. She appears well-developed.  HENT:  Head: Normocephalic.  Eyes: Conjunctivae and EOM are normal. No scleral icterus.  Neck: Neck supple. No thyromegaly present.  Cardiovascular: Normal rate and regular rhythm.  Exam reveals no gallop and no friction rub.   No murmur heard. Pulmonary/Chest: No stridor. She has no wheezes. She has no rales. She exhibits no tenderness.  Abdominal: She exhibits no distension. There is no tenderness. There is no rebound.  Musculoskeletal: Normal range of motion. She exhibits no edema.  Lymphadenopathy:    She has no  cervical adenopathy.  Neurological: She is oriented to person, place, and time. She exhibits normal muscle tone. Coordination normal.  Skin: No rash noted. No erythema.  Psychiatric: She has a normal mood and affect. Her behavior is normal.    ED Course  Procedures (including critical care time) Labs Review Labs Reviewed - No data to display  Imaging Review No results found.   EKG Interpretation None      MDM   Final  diagnoses:  Headache above the eye region   Pt  Improved with tx for headache    Benny LennertJoseph L Tawonda Legaspi, MD 01/30/14 1620

## 2014-01-30 NOTE — ED Notes (Signed)
[facility]".

## 2014-01-30 NOTE — ED Provider Notes (Signed)
Chief Complaint   Skin Problem   History of Present Illness   Diana Liu is a 33 year old female who presents today with headache, chest pain, an abscess in her right axilla.  The headache has been going on 3 days. It's bitemporal, bifrontal, and throbbing rated 10 over 10 and is described as a worst headache of her life. She is unable to tell me the length of time between onset and peak of pain. She's had no associated nausea or vomiting. She has had photophobia and phonophobia and spots in front of both eyes. Also yesterday she had an episode of muscle spasm of her right arm and right leg that lasted for minutes. This was accompanied by numbness and tingling in her right arm and right leg that is still going on today. She denies any muscle weakness. She's had no diplopia, but her vision is blurry. She denies any fever, chills, or stiff neck. She has no trouble with gait or ambulation. The patient states she has had headaches before and seen a physician for these, but has never been given a diagnosis of migraines. She has never taken any medication for it.  The chest pain has been going on since yesterday. She's had similar pains off and on for 2 years. They may occur once or twice a week and can last for hours to days at a time. The pain is located in the right parasternal area with radiation to the lateral rib cage. The pain is nonexertional. She has had some shortness of breath, sweats, and describes the pain is sometimes pleuritic. She denies any nausea, palpitations, or dizziness. She's had some coughing. She denies any wheezing. She is a former cigarette smoker. No history of asthma. She has no cardiac history and no other risk factors.  Finally, she has a history of recurring abscesses in both axillas. The present abscesses been going on for a week and is draining pus. She's had no fever. She denies any abscesses in the groin areas.  Review of Systems     Other than as noted above, the  patient denies any of the following symptoms: Systemic:  No fever, chills, fatigue, myalgias, headache, or anorexia. Eye:  No redness, pain or drainage. ENT:  No earache, nasal congestion, rhinorrhea, sinus pressure, or sore throat. Lungs:  No cough, sputum production, wheezing, shortness of breath.  Cardiovascular:  No chest pain, palpitations, or syncope. GI:  No nausea, vomiting, abdominal pain or diarrhea. GU:  No dysuria, frequency, or hematuria. Skin:  No rash or pruritis.   PMFSH     Past medical history, family history, social history, meds, and allergies were reviewed.  Last menstrual period was November 21. She is sexually active. She's had a tubal ligation.  Physical Examination    Vital signs:  BP 114/71 mmHg  Pulse 71  Temp(Src) 99.5 F (37.5 C) (Oral)  Resp 16  SpO2 98%  LMP 01/19/2014 (Exact Date) General:  Alert, in no distress. Eye:  PERRL, full EOMs.  Lids and conjunctivas were normal. ENT:  TMs and canals were normal, without erythema or inflammation.  Nasal mucosa was clear and uncongested, without drainage.  Mucous membranes were moist.  Pharynx was clear, without exudate or drainage.  There were no oral ulcerations or lesions. Neck:  Supple, no adenopathy, tenderness or mass. Thyroid was normal. Lungs:  No respiratory distress.  Lungs were clear to auscultation, without wheezes, rales or rhonchi.  Breath sounds were clear and equal bilaterally. Heart:  Regular rhythm,  without gallops, murmers or rubs. Chest: There has pain to palpation in the right parasternal area which mimics the pain that she's been having. Abdomen:  Soft, flat, and non-tender to palpation.  No hepatosplenomagaly or mass. Neurological examination: The patient is alert and oriented x3. Speech is clear, fluent, and appropriate. Cranial nerves are intact. There is no pronator drift and finger to nose was normal. Muscle strength, and DTRs are normal. She reports diminished sensation to light touch  over the dorsum of the right hand. Babinskis are downgoing. Station and gait were normal. Romberg sign is negative, patient is able to perform tandem gait well. Skin:  Clear, warm, and dry, without rash or lesions. There is an oval, 1 x 3 cm abscess in the right axilla which is tender and fluctuant and draining a small amount of pus.  EKG Results:  Date: 01/30/2014  Rate: 66  Rhythm: normal sinus rhythm  QRS Axis: normal--20  Intervals: normal  ST/T Wave abnormalities: normal  Conduction Disutrbances:right bundle branch block  Narrative Interpretation: Normal sinus rhythm, incomplete right bundle branch block, otherwise normal.  Old EKG Reviewed: none available    Procedure Note:  Verbal informed consent was obtained from the patient.  Risks and benefits were outlined with the patient.  Patient understands and accepts these risks. A time out was called and the name of the procedure, the procedure site, and identity of the patient were confirmed verbally and by wristband.    The procedure was then performed as follows:  The abscess in the right axilla was prepped with Betadine and alcohol, anesthetized with 5 mL of 2% Xylocaine with epinephrine, an incision was made into the area of fluctuance yielding a small amount of pus, and a small cystic structure. The wound cavity was packed with iodoform gauze. A sterile dressing was applied, and she was told return in 48 hours for packing removal.  The patient tolerated the procedure well without any immediate complications.     Course in Urgent Care Center   The following medications were given:  Medications  HYDROcodone-acetaminophen (NORCO/VICODIN) 5-325 MG per tablet 2 tablet   Assessment   The primary encounter diagnosis was Abscess. Diagnoses of Musculoskeletal chest pain, Acute nonintractable headache, unspecified headache type, and Paresthesia were also pertinent to this visit.  Her chest pain appears to be musculoskeletal, I don't  think this needs any further workup.  For her headache, this has features of migraine headache, persistent numbness and tingling of the right arm and right leg, description of the worst headache of her life, and paresthesias to light touch over the dorsum of her right hand are concerning, therefore I'm transferring her for further evaluation.  For the abscess, I think she has hidradenitis suppurativa. She'll need to return to see us in 2 days for packing removal. I'll send a prescription into her pharmacy for Septra.  Finally, she doesn't have a primary care physician and will [facility] get [facility].  Plan     The patient was transferred to the ED via shuttle in stable condition.  Medical Decision Making:  33 year old female presents today with headache, chest pain and an abscess in right axilla.  Her EKG was normal and her chest pain is reproducable with pressure.  It is my impression that this is musculoskeletal.  We I&Dd the abscess and packed it.  We have sent a Rx for Septra to her pharmacy.  We will see her back  in 48 hours.  The thing that concerns me most is her headache.  She has had headaches before, never been diagnosed as migraine, and states that this is the worst of her life.  She is unable to tell me how long from onset to peak pain.  She rates it 10/10.  She denies fever or stiff neck.  She does state that yesterday she had an episode of spasm of her right arm and leg that lasted minutes and numbness and tingling of her right arm and leg that has persisted up until the present.  On exam, she reports altered sensation over the dorsum of her right hand.  I am concerned about CVA or anuerism and feel she needs further imaging.          Reuben Likes, MD 01/30/14 [phone]

## 2014-01-30 NOTE — ED Notes (Signed)
C/o 2 day duration of pain in her chest that comes and goes. Pain not reproduced w deep breathing or direct chest palpation . Also concerned about recurrent abscess in right axilla area (thinks this one has already burst open ) . Also concerned about head aches . NAD, w/d/color good. Pain does not radiate

## 2014-01-30 NOTE — Discharge Instructions (Signed)
We have determined that your problem requires further evaluation in the emergency department.  We will take care of your transport there.  Once at the emergency department, you will be evaluated by a provider and they will order whatever treatment or tests they deem necessary.  We cannot guarantee that they will do any specific test or do any specific treatment.  ° °

## 2014-01-30 NOTE — Discharge Instructions (Signed)
Follow up as needed

## 2014-02-02 ENCOUNTER — Telehealth (HOSPITAL_COMMUNITY): Payer: Self-pay | Admitting: *Deleted

## 2014-02-02 LAB — CULTURE, ROUTINE-ABSCESS

## 2014-02-02 NOTE — Progress Notes (Signed)
Quick Note:  Result is abnormal as noted. We will inform patient about abnormal result. She was treated with Septra which should be sufficient. Please call and inform her of this result. She was sent to the hospital for what turned out to be a migraine headache. ______

## 2014-02-02 NOTE — ED Notes (Signed)
Abscess culture: Mod. Proteus Mirabilis. Pt. adequately treated with Septra.  I called pt.  Pt. verified x 2 and given result.  Pt. told to finish all of Septra and come back for packing removal. [name], [name] 02/02/2014

## 2015-01-28 ENCOUNTER — Encounter (HOSPITAL_COMMUNITY): Payer: Self-pay

## 2015-01-28 ENCOUNTER — Emergency Department (HOSPITAL_COMMUNITY)
Admission: EM | Admit: 2015-01-28 | Discharge: 2015-01-28 | Disposition: A | Discharge status: Home / Self Care | Payer: Self-pay | Attending: Physician Assistant | Admitting: Physician Assistant

## 2015-01-28 DIAGNOSIS — Z3202 Encounter for pregnancy test, result negative: Secondary | ICD-10-CM | POA: Insufficient documentation

## 2015-01-28 DIAGNOSIS — Z862 Personal history of diseases of the blood and blood-forming organs and certain disorders involving the immune mechanism: Secondary | ICD-10-CM | POA: Insufficient documentation

## 2015-01-28 DIAGNOSIS — Z87891 Personal history of nicotine dependence: Secondary | ICD-10-CM | POA: Insufficient documentation

## 2015-01-28 DIAGNOSIS — A084 Viral intestinal infection, unspecified: Secondary | ICD-10-CM | POA: Insufficient documentation

## 2015-01-28 LAB — URINALYSIS, ROUTINE W REFLEX MICROSCOPIC
GLUCOSE, UA: NEGATIVE mg/dL
Ketones, ur: 15 mg/dL — AB
NITRITE: NEGATIVE
PROTEIN: NEGATIVE mg/dL
Specific Gravity, Urine: 1.025 (ref 1.005–1.030)
pH: 5.5 (ref 5.0–8.0)

## 2015-01-28 LAB — COMPREHENSIVE METABOLIC PANEL
ALT: 27 U/L (ref 14–54)
AST: 23 U/L (ref 15–41)
Albumin: 4.1 g/dL (ref 3.5–5.0)
Alkaline Phosphatase: 57 U/L (ref 38–126)
Anion gap: 6 (ref 5–15)
BILIRUBIN TOTAL: 0.4 mg/dL (ref 0.3–1.2)
BUN: 9 mg/dL (ref 6–20)
CO2: 26 mmol/L (ref 22–32)
CREATININE: 0.98 mg/dL (ref 0.44–1.00)
Calcium: 9.2 mg/dL (ref 8.9–10.3)
Chloride: 108 mmol/L (ref 101–111)
GFR calc Af Amer: >60 mL/min (ref >60)
Glucose, Bld: 94 mg/dL (ref 65–99)
Potassium: 3.6 mmol/L (ref 3.5–5.1)
SODIUM: 140 mmol/L (ref 135–145)
TOTAL PROTEIN: 7.2 g/dL (ref 6.5–8.1)

## 2015-01-28 LAB — CBC WITH DIFFERENTIAL/PLATELET
BASOS ABS: 0.1 10*3/uL (ref 0.0–0.1)
Basophils Relative: 1 %
EOS ABS: 0.2 10*3/uL (ref 0.0–0.7)
EOS PCT: 2 %
HCT: 41 % (ref 36.0–46.0)
Hemoglobin: 14.8 g/dL (ref 12.0–15.0)
Lymphocytes Relative: 26 %
Lymphs Abs: 2.2 10*3/uL (ref 0.7–4.0)
MCH: 29 pg (ref 26.0–34.0)
MCHC: 36.1 g/dL — ABNORMAL HIGH (ref 30.0–36.0)
MCV: 80.2 fL (ref 78.0–100.0)
Monocytes Absolute: 0.6 10*3/uL (ref 0.1–1.0)
Monocytes Relative: 7 %
Neutro Abs: 5.3 10*3/uL (ref 1.7–7.7)
Neutrophils Relative %: 64 %
PLATELETS: 220 10*3/uL (ref 150–400)
RBC: 5.11 MIL/uL (ref 3.87–5.11)
RDW: 14.8 % (ref 11.5–15.5)
WBC: 8.4 10*3/uL (ref 4.0–10.5)

## 2015-01-28 LAB — URINE MICROSCOPIC-ADD ON

## 2015-01-28 LAB — PREGNANCY, URINE: Preg Test, Ur: NEGATIVE

## 2015-01-28 LAB — LIPASE, BLOOD: LIPASE: 38 U/L (ref 11–51)

## 2015-01-28 MED ORDER — ONDANSETRON HCL 4 MG/2ML IJ SOLN
4.0000 mg | Freq: Once | INTRAMUSCULAR | Status: AC
Start: 2015-01-28 — End: 2015-01-28
  Administered 2015-01-28: 4 mg via INTRAVENOUS
  Filled 2015-01-28: qty 2

## 2015-01-28 MED ORDER — SODIUM CHLORIDE 0.9 % IV BOLUS (SEPSIS)
1000.0000 mL | Freq: Once | INTRAVENOUS | Status: AC
  Administered 2015-01-28: 1000 mL via INTRAVENOUS

## 2015-01-28 MED ORDER — ONDANSETRON HCL 4 MG PO TABS: 4.0000 mg | ORAL_TABLET | Freq: Three times a day (TID) | ORAL | Status: DC | PRN

## 2015-01-28 NOTE — ED Notes (Signed)
Patient reports that she developed sharp abdominal pain while at work yesterday and then developed vomiting and diarrhea at midnight. Reports that she has gone at least 4 times with each. Also complains of cloudy urine but denies dysuria

## 2015-01-28 NOTE — ED Notes (Signed)
Pt taking PO fluids well with no vomiting

## 2015-01-28 NOTE — ED Provider Notes (Signed)
CSN: [id number]     Arrival date & time 01/28/15  [id number] History   First MD Initiated Contact with Patient 01/28/15 [id number]     Chief Complaint  Patient presents with  . n-v-d      (Consider location/radiation/quality/duration/timing/severity/associated sxs/prior Treatment) HPI   Patient is a 34 year old female presenting with nausea vomiting since last night. Patient reports she's had some cramping and started with diarrhea last night. She started vomiting overnight. She reports no fever. Patient reports some cloudy urine with no dysuria. Patient is currently menstruating no recent strange food intake or sick contacts.    Past Medical History  Diagnosis Date  . Sickle cell trait ([facility])   . Abnormal Pap smear    Past Surgical History  Procedure Laterality Date  . Dilation and curettage of uterus    . Tubal ligation  09/11/2011    Procedure: POST PARTUM TUBAL LIGATION;  Surgeon: Reva Boresanya S Pratt, MD;  Location: [facility];  Service: Gynecology;  Laterality: Bilateral;  Bilateral post partum tubal ligation with filshie clips.   Family History  Problem Relation Age of Onset  . Hypertension Mother   . Diabetes Mother   . Anesthesia problems Neg Hx    Social History  Substance Use Topics  . Smoking status: Former Smoker -- 3 years    Types: Cigarettes  . Smokeless tobacco: None  . Alcohol Use: No   OB History    Gravida Para Term Preterm AB TAB SAB Ectopic Multiple Living   6 4 4  2 1 1   4      Review of Systems  Constitutional: Negative for activity change and fatigue.  HENT: Negative for congestion.   Eyes: Negative for discharge.  Respiratory: Negative for cough and chest tightness.   Cardiovascular: Negative for chest pain.  Gastrointestinal: Positive for nausea and diarrhea. Negative for abdominal pain, constipation and abdominal distention.  Genitourinary: Negative for dysuria.  Musculoskeletal: Negative for joint swelling.  Skin: Negative for rash.  Allergic/Immunologic:  Negative for immunocompromised state.  Neurological: Positive for light-headedness.  Psychiatric/Behavioral: Negative for agitation.      Allergies  Review of patient's allergies indicates no known allergies.  Home Medications   Prior to Admission medications   Medication Sig Start Date End Date Taking? Authorizing Provider  HYDROcodone-acetaminophen (NORCO/VICODIN) 5-325 MG per tablet Take 1 tablet by mouth every 6 (six) hours as needed for moderate pain. 01/30/14   Bethann BerkshireJoseph Zammit, MD  sulfamethoxazole-trimethoprim (BACTRIM DS,SEPTRA DS) 800-160 MG per tablet Take 2 tablets by mouth 2 (two) times daily. Patient not taking: Reported on 01/30/2014 01/30/14   Reuben Likesavid C Keller, MD   BP 125/82 mmHg  Pulse 77  Temp(Src) 98.5 F (36.9 C) (Oral)  Resp 18  Ht 5\' 6"  (1.676 m)  Wt 165 lb (74.844 kg)  BMI 26.64 kg/m2  SpO2 100%  LMP 01/25/2015 Physical Exam  Constitutional: She is oriented to person, place, and time. She appears well-developed and well-nourished.  HENT:  Head: Normocephalic and atraumatic.  Eyes: Conjunctivae are normal. Right eye exhibits no discharge.  Neck: Neck supple.  Cardiovascular: Normal rate, regular rhythm and normal heart sounds.   No murmur heard. Pulmonary/Chest: Effort normal and breath sounds normal. She has no wheezes. She has no rales.  Abdominal: Soft. She exhibits no distension. There is no tenderness.  Musculoskeletal: Normal range of motion. She exhibits no edema.  Neurological: She is oriented to person, place, and time. No cranial nerve deficit.  Skin: Skin is warm and dry.  No rash noted. She is not diaphoretic.  Psychiatric: She has a normal mood and affect. Her behavior is normal.  Nursing note and vitals reviewed.   ED Course  Procedures (including critical care time) Labs Review Labs Reviewed  URINE CULTURE  COMPREHENSIVE METABOLIC PANEL  CBC WITH DIFFERENTIAL/PLATELET  LIPASE, BLOOD  URINALYSIS, ROUTINE W REFLEX MICROSCOPIC (NOT AT  [facility])  PREGNANCY, URINE    Imaging Review No results found. I have personally reviewed and evaluated these images and lab results as part of my medical decision-making.   EKG Interpretation None      MDM   Final diagnoses:  None   she is a 34 year old female presenting with nausea vomiting starting last night. We will get a pregnancy test. We'll get CBC and Chem-7 to make sure patient does not have electrolyte abnormalities. Plan to give fluids, Zofran. I think this likely represent viral gastroenteritis. Patient has no tenderness on exam. We'll treat with symptomatic care and likely be ablel to discharge home with follow up with PCP.  Maevis Mumby Randall An, MD 01/28/15 [phone]

## 2015-01-28 NOTE — Discharge Instructions (Signed)

## 2015-01-30 LAB — URINE CULTURE: Culture: >=100000

## 2015-01-31 ENCOUNTER — Telehealth (HOSPITAL_COMMUNITY): Payer: Self-pay

## 2015-01-31 NOTE — Telephone Encounter (Signed)
Post ED Visit - Positive Culture Follow-up: Successful Patient Follow-Up  Culture assessed and recommendations reviewed by: []  [name], Pharm.D. []  [name], Pharm.D., BCPS []  [name], Pharm.D. []  [name], Pharm.D., BCPS [x]  [name], [address]Pharm.D., BCPS, AAHIVP []  [name], Pharm.D., BCPS, AAHIVP []  [name], Pharm.D. []  [name], [address]Ph[address]  Positive urine culture, >/= 100,000 colonies -> Citrobacter Koseri  [x]  Patient discharged without antimicrobial prescription and treatment is now indicated []  Organism is resistant to prescribed ED discharge antimicrobial []  Patient with positive blood cultures  Changes discussed with ED provider: A. Harris PA  New antibiotic prescription "Septra DS, 1 po BID x 3 days" [facility] [phone] and given to the pharmacist  Contacted patient, date 01/31/2015, time 10:39  Pt informed.     Diana Liu, Diana Liu 01/31/2015, 10:42 AM

## 2016-11-11 ENCOUNTER — Encounter (HOSPITAL_COMMUNITY): Payer: Self-pay | Admitting: *Deleted

## 2016-11-11 ENCOUNTER — Emergency Department (HOSPITAL_COMMUNITY)
Admission: EM | Admit: 2016-11-11 | Discharge: 2016-11-11 | Disposition: A | Discharge status: Home / Self Care | Payer: Self-pay | Attending: Emergency Medicine | Admitting: Emergency Medicine

## 2016-11-11 ENCOUNTER — Emergency Department (HOSPITAL_COMMUNITY): Payer: Self-pay

## 2016-11-11 DIAGNOSIS — B349 Viral infection, unspecified: Secondary | ICD-10-CM | POA: Insufficient documentation

## 2016-11-11 DIAGNOSIS — B9789 Other viral agents as the cause of diseases classified elsewhere: Secondary | ICD-10-CM

## 2016-11-11 DIAGNOSIS — Z87891 Personal history of nicotine dependence: Secondary | ICD-10-CM | POA: Insufficient documentation

## 2016-11-11 DIAGNOSIS — J069 Acute upper respiratory infection, unspecified: Secondary | ICD-10-CM | POA: Insufficient documentation

## 2016-11-11 MED ORDER — BENZONATATE 100 MG PO CAPS: 100.0000 mg | ORAL_CAPSULE | Freq: Three times a day (TID) | ORAL | 0 refills | Status: DC

## 2016-11-11 MED ORDER — PHENOL 1.4 % MT LIQD: 1.0000 | OROMUCOSAL | 0 refills | Status: DC | PRN

## 2016-11-11 MED ORDER — FLUTICASONE PROPIONATE 50 MCG/ACT NA SUSP: 1.0000 | Freq: Every day | NASAL | 0 refills | Status: AC

## 2016-11-11 NOTE — ED Provider Notes (Signed)
[facility]-EMERGENCY DEPT Provider Note   CSN: [id number] Arrival date & time: 11/11/16  1415     History   Chief Complaint Chief Complaint  Patient presents with  . Cough    HPI Diana Liu is a 36 y.o. female presenting with 3 days of nasal congestion, cough, and rhinorrhea.  Patient states that the past 3 days, she's had nasal congestion with rhinorrhea and nonproductive cough. She has associated sinus pressure and sore throat. She reports bilateral chest pain only when she coughs. Coughing is worse at night, and she reports postnasal drip at night. She has been taking TheraFlu without relief. She denies fevers or chills. She states her neighbor was sick with similar symptoms, and they shared a cigarette just prior to her getting sick. She denies other sick contacts. She denies eye pain or drainage, chest pain, shortness of breath, nausea, vomiting, abdominal pain, urinary symptoms, or abnormal bowel movements. She does not have a rash.   HPI  Past Medical History:  Diagnosis Date  . Abnormal Pap smear   . Sickle cell trait [facility])     Patient Active Problem List   Diagnosis Date Noted  . Normal delivery 09/11/2011  . Polyhydramnios, delivered, current hospitalization 09/11/2011  . Sterilization 09/11/2011  . Supervision of normal pregnancy 09/06/2011  . Smoker 09/06/2011  . 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 DILATION AND CURETTAGE OF UTERUS    . TUBAL LIGATION  09/11/2011   Procedure: POST PARTUM TUBAL LIGATION;  Surgeon: Reva Bores, MD;  Location: [facility];  Service: Gynecology;  Laterality: Bilateral;  Bilateral post partum tubal ligation with filshie clips.    OB History    Gravida Para Term Preterm AB Living   SAB TAB Ectopic Multiple Live Births   Home Medications    Prior to Admission  medications   Medication Sig Start Date End Date Taking? Authorizing Provider  benzonatate (TESSALON) 100 MG capsule Take 1 capsule (100 mg total) by mouth every 8 (eight) hours. 11/11/16   Becka Lagasse, PA-C  fluticasone (FLONASE) 50 MCG/ACT nasal spray Place 1 spray into both nostrils daily. 11/11/16   Alaiyah Bollman, PA-C  HYDROcodone-acetaminophen (NORCO/VICODIN) 5-325 MG per tablet Take 1 tablet by mouth every 6 (six) hours as needed for moderate pain. Patient not taking: Reported on 01/28/2015 01/30/14   Bethann Berkshire, MD  ondansetron (ZOFRAN) 4 MG tablet Take 1 tablet (4 mg total) by mouth every 8 (eight) hours as needed for nausea or vomiting. 01/28/15   Mackuen, Courteney Lyn, MD  phenol (CHLORASEPTIC) 1.4 % LIQD Use as directed 1 spray in the mouth or throat as needed for throat irritation / pain. 11/11/16   Casin Federici, PA-C    Family History Family History  Problem Relation Age of Onset  . Hypertension Mother   . Diabetes Mother   . Anesthesia problems Neg Hx     Social History Social History  Substance Use Topics  . Smoking status: Former Smoker    Years: 3.00    Types: Cigarettes  . Smokeless tobacco: Not on file  . Alcohol use No     Allergies   Patient has no known allergies.   Review of Systems Review of Systems  Constitutional: Negative for chills  and fever.  HENT: Positive for congestion, postnasal drip, rhinorrhea, sinus pressure and sore throat. Negative for trouble swallowing and voice change.   Eyes: Negative for pain and discharge.  Respiratory: Positive for cough. Negative for chest tightness and shortness of breath.   Gastrointestinal: Negative for abdominal pain, constipation, diarrhea, nausea and vomiting.     Physical Exam Updated Vital Signs BP 121/84   Pulse 81   Temp 99.2 F (37.3 C) (Oral)   Resp 16   LMP 10/27/2016   SpO2 98%   Physical Exam  Constitutional: She is oriented to person, place, and time. She appears  well-developed and well-nourished. No distress.  HENT:  Head: Normocephalic and atraumatic.  Right Ear: Tympanic membrane, external ear and ear canal normal.  Left Ear: Tympanic membrane, external ear and ear canal normal.  Nose: Mucosal edema and rhinorrhea present. Right sinus exhibits no maxillary sinus tenderness and no frontal sinus tenderness. Left sinus exhibits no maxillary sinus tenderness and no frontal sinus tenderness.  Mouth/Throat: Uvula is midline, oropharynx is clear and moist and mucous membranes are normal. No tonsillar exudate.  Eyes: Pupils are equal, round, and reactive to light. Conjunctivae and EOM are normal.  Neck: Normal range of motion.  Cardiovascular: Normal rate, regular rhythm and intact distal pulses.   Pulmonary/Chest: Effort normal and breath sounds normal. No respiratory distress. She has no decreased breath sounds. She has no wheezes. She has no rhonchi. She has no rales. She exhibits no tenderness.  Abdominal: Soft. Bowel sounds are normal. She exhibits no distension and no mass. There is no tenderness. There is no guarding.  Musculoskeletal: Normal range of motion.  Lymphadenopathy:    She has cervical adenopathy.  Neurological: She is alert and oriented to person, place, and time.  Skin: Skin is warm and dry. No rash noted.  Psychiatric: She has a normal mood and affect.  Nursing note and vitals reviewed.    ED Treatments / Results  Labs (all labs ordered are listed, but only abnormal results are displayed) Labs Reviewed - No data to display  EKG  EKG Interpretation None       Radiology Dg Chest 2 View  Result Date: 11/11/2016 CLINICAL DATA:  Dry cough and low-grade fever for several days EXAM: CHEST  2 VIEW COMPARISON:  None. FINDINGS: The heart size and mediastinal contours are within normal limits. Both lungs are clear. The visualized skeletal structures are unremarkable. IMPRESSION: No active cardiopulmonary disease. Electronically  Signed   By: Alcide CleverMark  Lukens M.D.   On: 11/11/2016 15:19    Procedures Procedures (including critical care time)  Medications Ordered in ED Medications - No data to display   Initial Impression / Assessment and Plan / ED Course  I have reviewed the triage vital signs and the nursing notes.  Pertinent labs & imaging results that were available during my care of the patient were reviewed by me and considered in my medical decision making (see chart for details).     Patient presents with 3 days of congestion cough. Physical exam reassuring, as patient with clear lung sounds and no exudate. Patient is afebrile not tachycardic. Does not appear distressed. cxr negative. Doubt pneumonia or bacterial source of illness. Likely viral URI. Will treat symptomatically. Discussed with patient importance of staying well hydrated. Discussed follow-up with health department or [place] and wellness if symptoms are not improving. At this time, patient appears safe for discharge. Return precautions given. Patient states she understands and agrees to plan.  Final Clinical Impressions(s) / ED Diagnoses   Final diagnoses:  Viral URI with cough    New Prescriptions Discharge Medication List as of 11/11/2016  4:46 PM    START taking these medications   Details  benzonatate (TESSALON) 100 MG capsule Take 1 capsule (100 mg total) by mouth every 8 (eight) hours., Starting Thu 11/11/2016, Print    fluticasone (FLONASE) 50 MCG/ACT nasal spray Place 1 spray into both nostrils daily., Starting Thu 11/11/2016, Print    phenol (CHLORASEPTIC) 1.4 % LIQD Use as directed 1 spray in the mouth or throat as needed for throat irritation / pain., Starting Thu 11/11/2016, Print         [place], Masiah Woody, PA-C 11/11/16 1719    Benjiman Core, MD 11/12/16 0010

## 2016-11-11 NOTE — Discharge Instructions (Signed)
Use Flonase once daily to help with nasal congestion and postnasal drip. Take Tessalon Perles as needed for cough. Use fentanyl spray as needed for sore throat. Take Tylenol or ibuprofen as needed for pain and fever. It is very important that you stay well-hydrated. Drink lots of water while you are sick. Wash your hands frequently to avoid spread of infection. [facility] or the health department in 10 days if symptoms are not improving Return to the emergency room if you feel significantly worse, or having difficulty breathing, had persistent fevers despite medication, or any new or worsening symptoms

## 2016-11-11 NOTE — ED Triage Notes (Signed)
To ED for eval of productive cough and chills for the past 3 days. OTC meds not working.

## 2017-02-16 ENCOUNTER — Emergency Department (HOSPITAL_COMMUNITY)
Admission: EM | Admit: 2017-02-16 | Discharge: 2017-02-16 | Disposition: A | Discharge status: Home / Self Care | Payer: Self-pay | Attending: Emergency Medicine | Admitting: Emergency Medicine

## 2017-02-16 ENCOUNTER — Other Ambulatory Visit: Payer: Self-pay

## 2017-02-16 ENCOUNTER — Encounter (HOSPITAL_COMMUNITY): Payer: Self-pay | Admitting: *Deleted

## 2017-02-16 DIAGNOSIS — J020 Streptococcal pharyngitis: Secondary | ICD-10-CM | POA: Insufficient documentation

## 2017-02-16 DIAGNOSIS — K122 Cellulitis and abscess of mouth: Secondary | ICD-10-CM | POA: Insufficient documentation

## 2017-02-16 DIAGNOSIS — Z87891 Personal history of nicotine dependence: Secondary | ICD-10-CM | POA: Insufficient documentation

## 2017-02-16 LAB — RAPID STREP SCREEN (MED CTR MEBANE ONLY): Streptococcus, Group A Screen (Direct): POSITIVE — AB

## 2017-02-16 MED ORDER — SODIUM CHLORIDE 0.9 % IV BOLUS (SEPSIS)
1000.0000 mL | Freq: Once | INTRAVENOUS | Status: AC
  Administered 2017-02-16: 1000 mL via INTRAVENOUS

## 2017-02-16 MED ORDER — PENICILLIN G BENZATHINE 1200000 UNIT/2ML IM SUSP
1.2000 10*6.[IU] | Freq: Once | INTRAMUSCULAR | Status: AC
  Administered 2017-02-16: 1.2 10*6.[IU] via INTRAMUSCULAR
  Filled 2017-02-16: qty 2

## 2017-02-16 MED ORDER — DEXAMETHASONE SODIUM PHOSPHATE 10 MG/ML IJ SOLN
10.0000 mg | Freq: Once | INTRAMUSCULAR | Status: AC
  Administered 2017-02-16: 10 mg via INTRAVENOUS
  Filled 2017-02-16: qty 1

## 2017-02-16 MED ORDER — KETOROLAC TROMETHAMINE 30 MG/ML IJ SOLN
30.0000 mg | Freq: Once | INTRAMUSCULAR | Status: AC
  Administered 2017-02-16: 30 mg via INTRAVENOUS
  Filled 2017-02-16: qty 1

## 2017-02-16 NOTE — ED Triage Notes (Addendum)
Pt is her with complaints of sorethroat on left side since Sunday and her left ear hurts.  Pt states she cannot swallow her spit.  Uvula swollen.  Pt states she can swallow but it hurts so bad

## 2017-02-16 NOTE — Discharge Instructions (Signed)
Use tylenol or motrin Adult dose is 1000 mg every 8 hours of tylenol and 800 mg every 8 hours of motrin. You may use children's chewable or liquid formulas which may be easier to swallow. You may also use chloraseptic spray to numb your throat. Contact a health care provider if: The glands in your neck continue to get bigger. You develop a rash, cough, or earache. You cough up a thick liquid that is green, yellow-brown, or bloody. You have pain or discomfort that does not get better with medicine. Your problems seem to be getting worse rather than better. You have a fever. Get help right away if: You have new symptoms, such as vomiting, severe headache, stiff or painful neck, chest pain, or shortness of breath. You have severe throat pain, drooling, or changes in your voice. You have swelling of the neck, or the skin on the neck becomes red and tender. You have signs of dehydration, such as fatigue, dry mouth, and decreased urination. You become increasingly sleepy, or you cannot wake up completely. Your joints become red or painful.

## 2017-02-16 NOTE — ED Provider Notes (Signed)
[facility] EMERGENCY DEPARTMENT Provider Note   CSN: [id number] Arrival date & time: 02/16/17  [id number]     History   Chief Complaint No chief complaint on file.   HPI Diana Liu is a   36 y.o. female with sore throat, myalgias, swollen glands, headache and and subjective fever for 4 days. Other symptoms: Unable to tolerate secretions, Sever pain, mild voice changes. No rashes or contacts with similar sxs.     HPI  Past Medical History:  Diagnosis Date  . Abnormal Pap smear   . Sickle cell trait [facility])     Patient Active Problem List   Diagnosis Date Noted  . Normal delivery 09/11/2011  . Polyhydramnios, delivered, current hospitalization 09/11/2011  . Sterilization 09/11/2011  . Supervision of normal pregnancy 09/06/2011  . Smoker 09/06/2011  . 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 DILATION AND CURETTAGE OF UTERUS    . TUBAL LIGATION  09/11/2011   Procedure: POST PARTUM TUBAL LIGATION;  Surgeon: Reva Boresanya S Pratt, MD;  Location: [facility];  Service: Gynecology;  Laterality: Bilateral;  Bilateral post partum tubal ligation with filshie clips.    OB History    Gravida Para Term Preterm AB Living   6 4 4   2 4    SAB TAB Ectopic Multiple Live Births   1 1     1        Home Medications    Prior to Admission medications   Medication Sig Start Date End Date Taking? Authorizing Provider  benzonatate (TESSALON) 100 MG capsule Take 1 capsule (100 mg total) by mouth every 8 (eight) hours. 11/11/16   Caccavale, Sophia, PA-C  fluticasone (FLONASE) 50 MCG/ACT nasal spray Place 1 spray into both nostrils daily. 11/11/16   Caccavale, Sophia, PA-C  HYDROcodone-acetaminophen (NORCO/VICODIN) 5-325 MG per tablet Take 1 tablet by mouth every 6 (six) hours as needed for moderate pain. Patient not taking: Reported on 01/28/2015 01/30/14    Bethann BerkshireZammit, Joseph, MD  ondansetron (ZOFRAN) 4 MG tablet Take 1 tablet (4 mg total) by mouth every 8 (eight) hours as needed for nausea or vomiting. 01/28/15   Mackuen, Courteney Lyn, MD  phenol (CHLORASEPTIC) 1.4 % LIQD Use as directed 1 spray in the mouth or throat as needed for throat irritation / pain. 11/11/16   Caccavale, Sophia, PA-C    Family History Family History  Problem Relation Age of Onset  . Hypertension Mother   . Diabetes Mother   . Anesthesia problems Neg Hx     Social History Social History   Tobacco Use  . Smoking status: Former Smoker    Years: 3.00    Types: Cigarettes  . Smokeless tobacco: Never Used  Substance Use Topics  . Alcohol use: No  . Drug use: No     Allergies   Patient has no known allergies.   Review of Systems Review of Systems  Ten systems reviewed and are negative for acute change, except as noted in the HPI.   Physical Exam Updated Vital Signs BP 133/88 (BP Location: Right Arm)   Pulse 77   Temp 99.4 F (37.4 C) (Oral)   Resp 18   LMP 02/16/2017 (Exact Date)   SpO2 97%   Physical Exam  Constitutional: She is oriented to person, place, and time. She appears well-developed and well-nourished. No distress.  tearful  HENT:  Head:  Normocephalic and atraumatic.  Mouth/Throat: Uvula is midline. No trismus in the jaw. Uvula swelling present. Posterior oropharyngeal edema and posterior oropharyngeal erythema present. No oropharyngeal exudate or tonsillar abscesses. Tonsils are 3+ on the right. Tonsils are 3+ on the left.  Slightly muffled voice  Eyes: Conjunctivae are normal. No scleral icterus.  Neck: Normal range of motion.  Cardiovascular: Normal rate, regular rhythm, normal heart sounds and intact distal pulses. Exam reveals no gallop and no friction rub.  No murmur heard. Pulmonary/Chest: Effort normal and breath sounds normal. No respiratory distress.  Abdominal: Soft. Bowel sounds are normal. She exhibits no distension and no  mass. There is no tenderness. There is no guarding.  Neurological: She is alert and oriented to person, place, and time.  Skin: Skin is warm and dry. She is not diaphoretic.  Psychiatric: Her behavior is normal.  Nursing note and vitals reviewed.    ED Treatments / Results  Labs (all labs ordered are listed, but only abnormal results are displayed) Labs Reviewed  RAPID STREP SCREEN (NOT AT [facility])    EKG  EKG Interpretation None       Radiology No results found.  Procedures Procedures (including critical care time)  Medications Ordered in ED Medications  sodium chloride 0.9 % bolus 1,000 mL (not administered)  ketorolac (TORADOL) 30 MG/ML injection 30 mg (not administered)  dexamethasone (DECADRON) injection 10 mg (not administered)     Initial Impression / Assessment and Plan / ED Course  I have reviewed the triage vital signs and the nursing notes.  Pertinent labs & imaging results that were available during my care of the patient were reviewed by me and considered in my medical decision making (see chart for details).  Clinical Course as of Feb 16 1257  Wed Feb 16, 2017  1208 Patient Strep pos. She opts for IM PCN G. Feeling improved after tx.  [AH]    Clinical Course User Index [AH] Arthor CaptainHarris, Briunna Leicht, PA-C    Pt with sore throat cervical lymphadenopathy, & dysphagia; diagnosis of strep. Treated in the Ed with steroids, NSAIDs, Pain medication and PCN IM.  Pt appears mildly dehydrated, discussed importance of water rehydration. Presentation non concerning for PTA or infxn spread to soft tissue. No trismus or uvula deviation. Specific return precautions discussed. Pt able to drink water in ED without difficulty with intact air way. Recommended PCP follow up.    Final Clinical Impressions(s) / ED Diagnoses   Final diagnoses:  Strep throat  Uvulitis    ED Discharge Orders    None       Arthor CaptainHarris, Talena Neira, PA-C 02/16/17 1258    Arby BarrettePfeiffer, Marcy, MD 02/16/17  [phone]

## 2017-02-16 NOTE — ED Notes (Signed)
Pt ambulated to room from waiting room, tolerated well. 

## 2018-03-07 IMAGING — DX DG CHEST 2V
2 series · 2 of 2 positions shown · non-contrast
Comparison: None.

CLINICAL DATA: Dry cough and low-grade fever for several days

EXAM:
CHEST  2 VIEW

[chest pa]
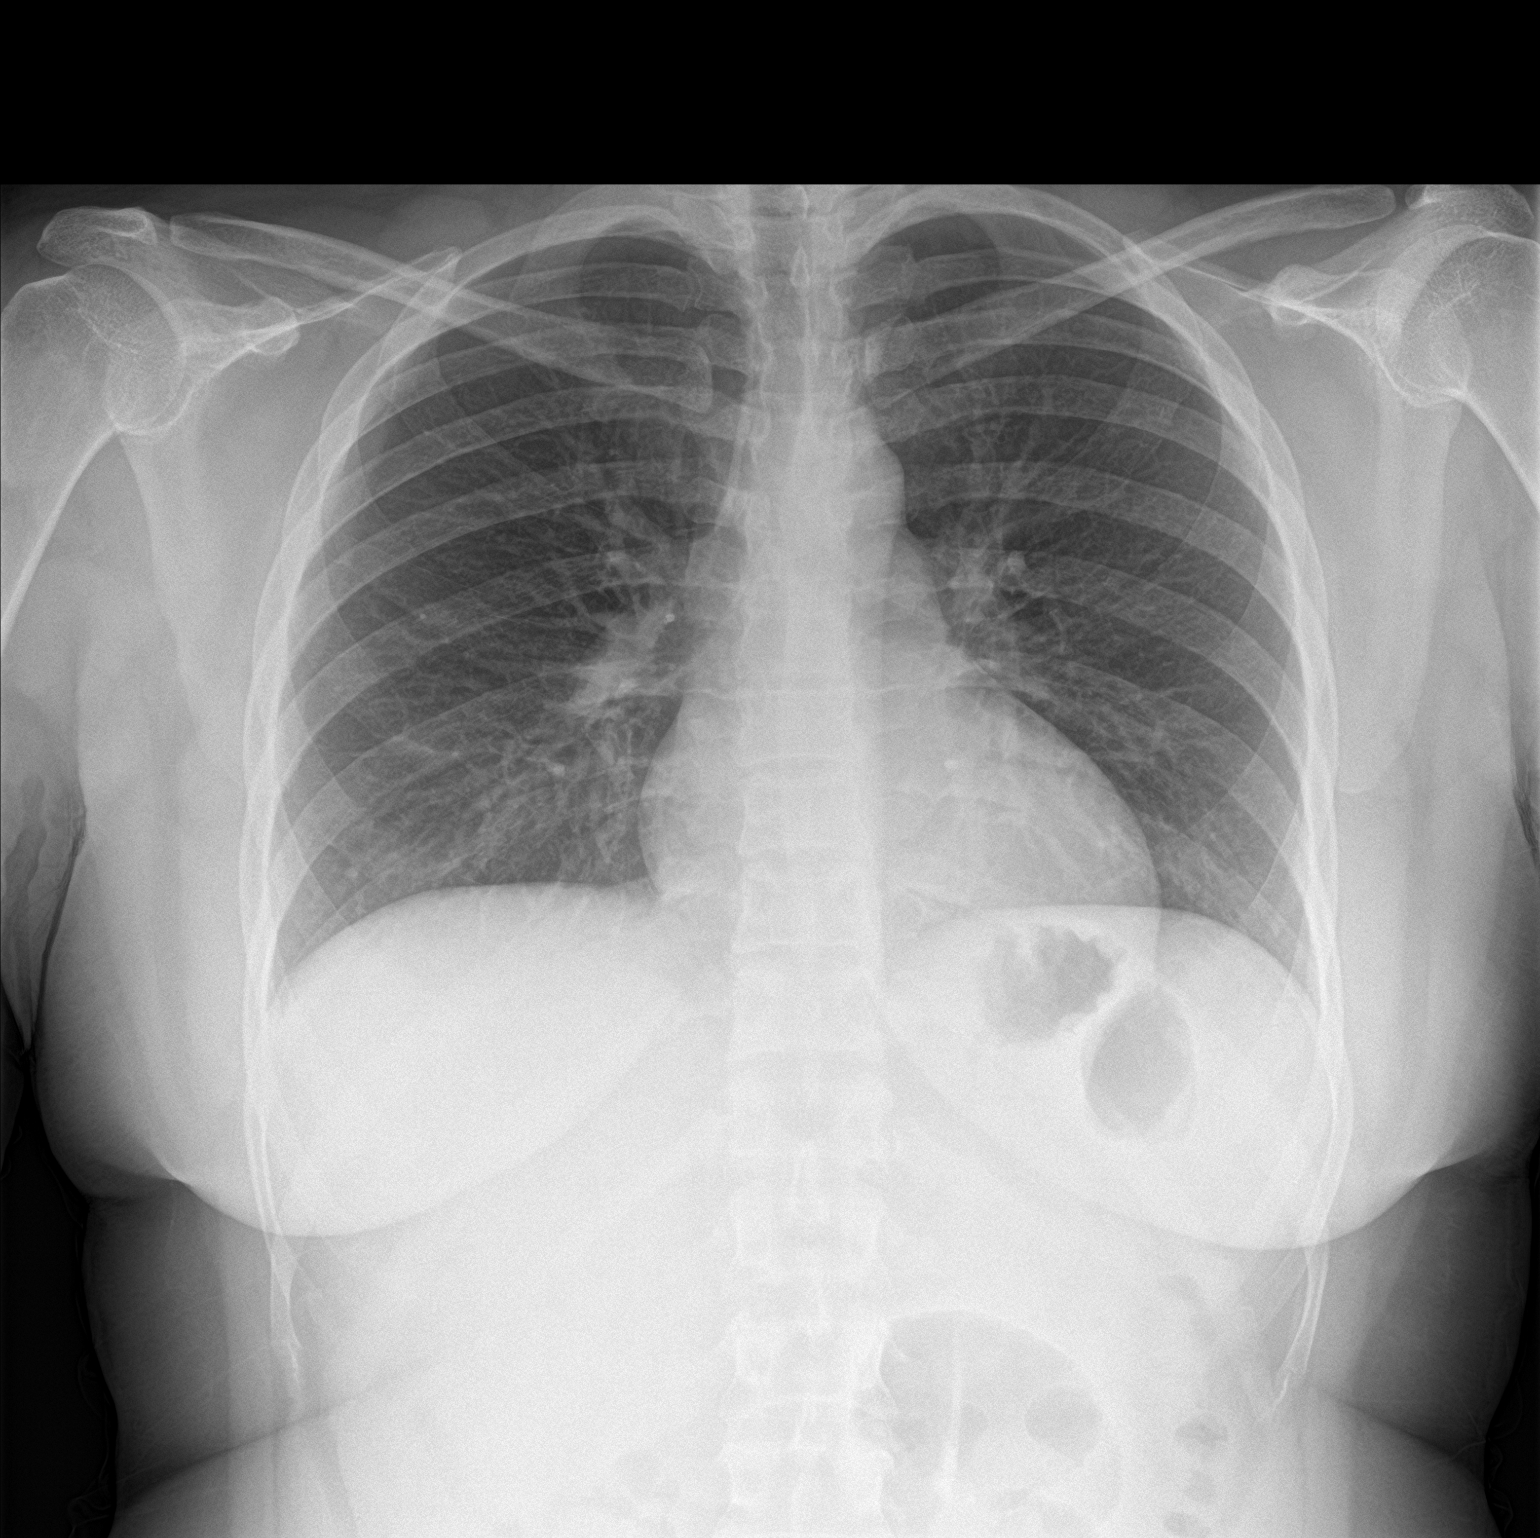

[chest lat]
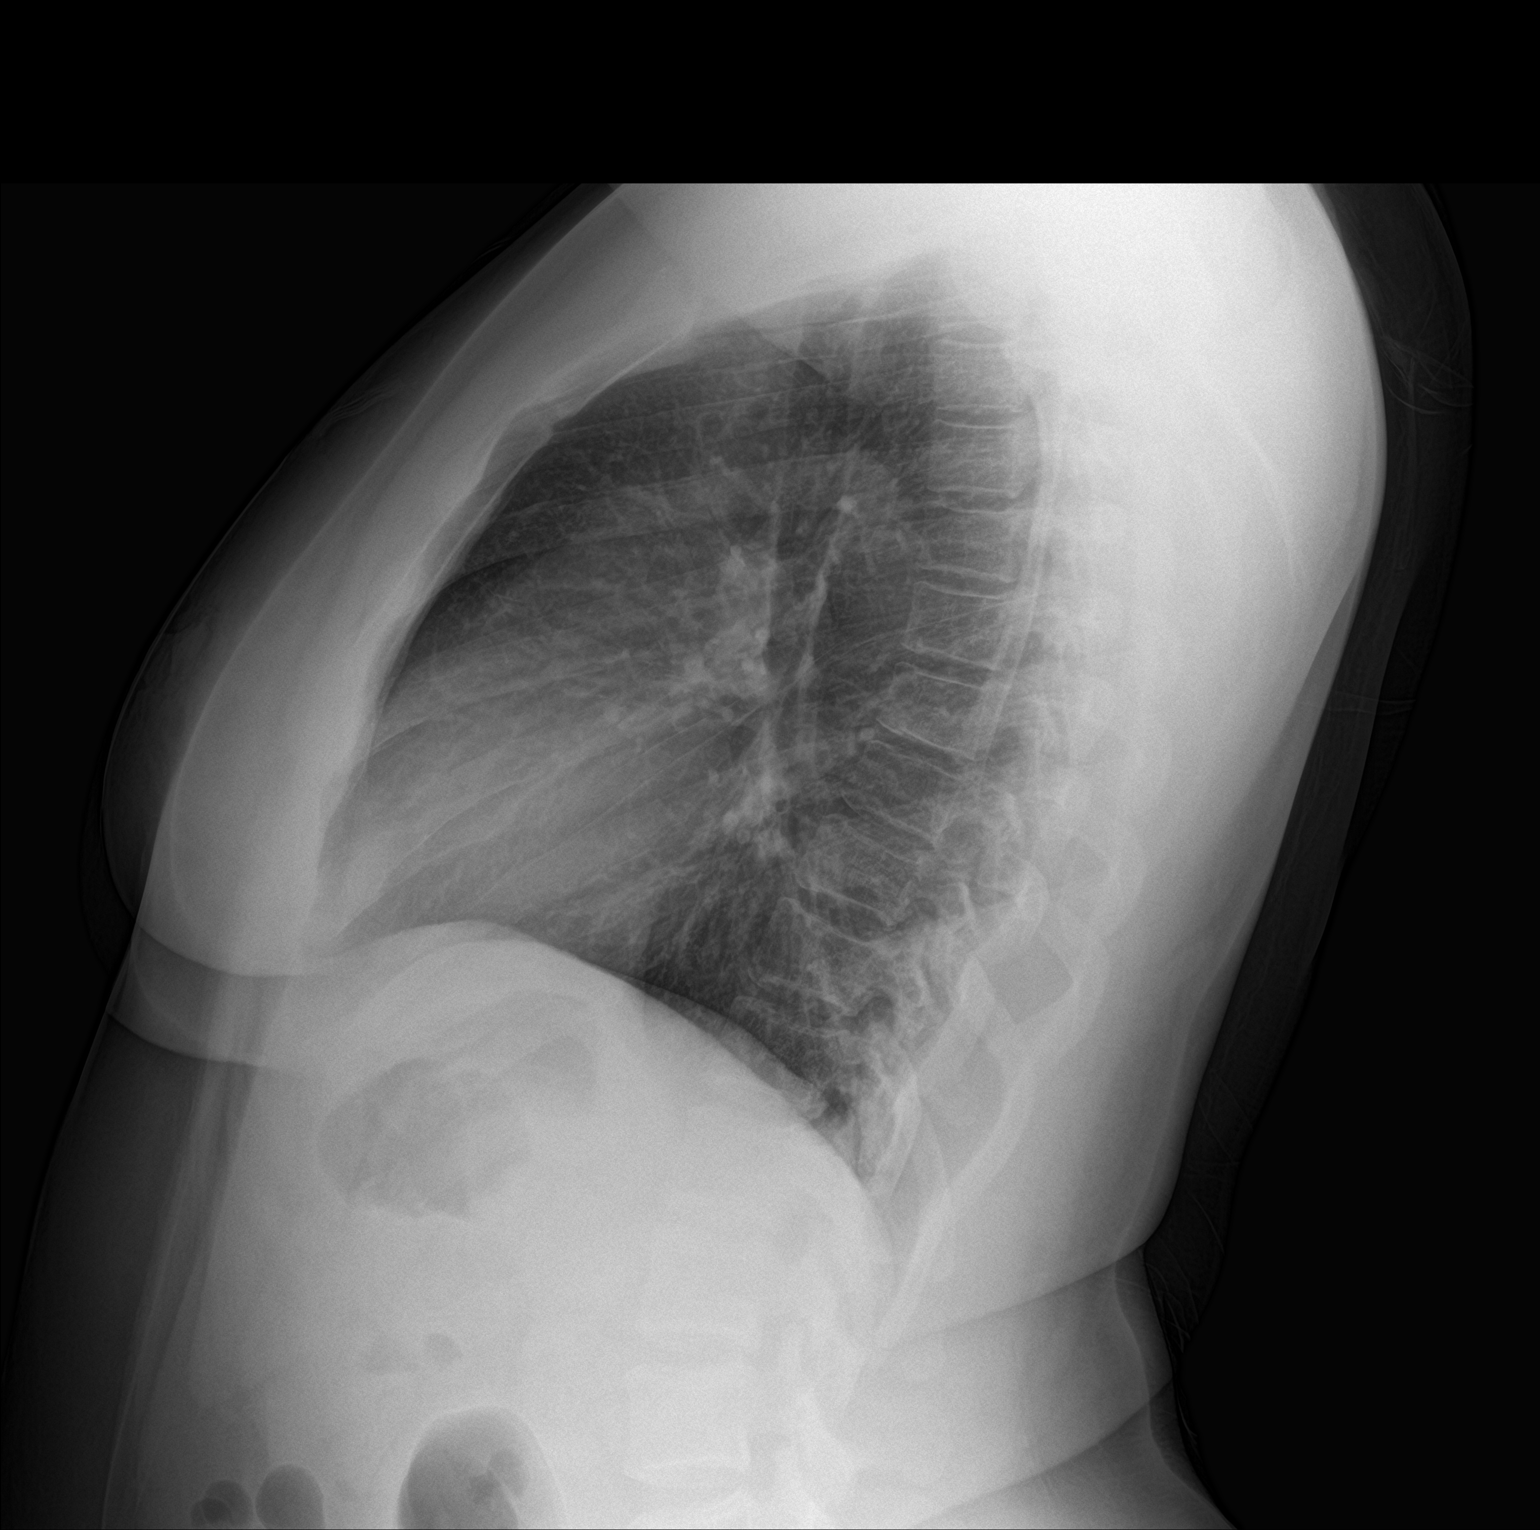

[2 of 2 positions shown; findings below may reference images not displayed]

FINDINGS: The heart size and mediastinal contours are within normal limits.
Both lungs are clear. The visualized skeletal structures are
unremarkable.
IMPRESSION: No active cardiopulmonary disease.

## 2022-05-05 ENCOUNTER — Encounter (HOSPITAL_COMMUNITY): Payer: Self-pay

## 2022-05-05 ENCOUNTER — Ambulatory Visit (HOSPITAL_COMMUNITY)
Admission: EM | Admit: 2022-05-05 | Discharge: 2022-05-05 | Disposition: A | Discharge status: Home / Self Care | Payer: Self-pay | Attending: Internal Medicine | Admitting: Internal Medicine

## 2022-05-05 DIAGNOSIS — S161XXA Strain of muscle, fascia and tendon at neck level, initial encounter: Secondary | ICD-10-CM

## 2022-05-05 MED ORDER — ACETAMINOPHEN 325 MG PO TABS
975.0000 mg | ORAL_TABLET | Freq: Once | ORAL | Status: AC
  Administered 2022-05-05: 975 mg via ORAL

## 2022-05-05 MED ORDER — KETOROLAC TROMETHAMINE 30 MG/ML IJ SOLN
30.0000 mg | Freq: Once | INTRAMUSCULAR | Status: AC
  Administered 2022-05-05: 30 mg via INTRAMUSCULAR

## 2022-05-05 MED ORDER — KETOROLAC TROMETHAMINE 30 MG/ML IJ SOLN
INTRAMUSCULAR | Status: AC
  Filled 2022-05-05: qty 1

## 2022-05-05 MED ORDER — METHOCARBAMOL 500 MG PO TABS: 500.0000 mg | ORAL_TABLET | Freq: Two times a day (BID) | ORAL | 0 refills | Status: AC

## 2022-05-05 MED ORDER — ACETAMINOPHEN 325 MG PO TABS
ORAL_TABLET | ORAL | Status: AC
  Filled 2022-05-05: qty 3

## 2022-05-05 NOTE — ED Triage Notes (Signed)
Patient states she has had posterior neck pain x 1 year, but worse in the past 2 days. Patient states the pain radiates into the upper back.  Patient states she took Tylenol 325 mg at 0700 today.

## 2022-05-05 NOTE — Discharge Instructions (Signed)
Your pain is likely due to a muscle strain which will improve on its own with time.   - Take ibuprofen '600mg'$  with food every 6 hours as needed for pain and inflammation. Do not take any other NSAID containing medicine when taking ibuprofen.   - You may start taking ibuprofen tomorrow since you were given a dose of ketorolac injection in clinic today for pain and inflammation. - You may also take the prescribed muscle relaxer as directed as needed for muscle aches/spasm.  Do not take this medication and drive or drink alcohol as it can make you sleepy.  Mainly use this medicine at nighttime as needed. - Apply heat 20 minutes on then 20 minutes off and perform gentle range of motion exercises to the area of greatest pain to prevent muscle stiffness and provide further pain relief.   Red flag symptoms to watch out for are numbness/tingling to the legs, weakness, loss of bowel/bladder control, and/or worsening pain that does not respond well to medicines. Follow-up with your primary care provider or return to urgent care if your symptoms do not improve in the next 3 to 4 days with medications and interventions recommended today. If your symptoms are severe (red flag), please go to the emergency room.  I hope you feel better!

## 2022-05-09 NOTE — ED Provider Notes (Signed)
[place]    CSN: [id number] Arrival date & time: 05/05/22  1059      History   Chief Complaint Chief Complaint  Patient presents with   Neck Pain    HPI Diana Liu is a 42 y.o. female.   Patient presents to urgent care for evaluation of bilateral neck pain that has been intermittent over the last 1 year but has worsened significantly over the last couple of days.  Neck pain is much worse with movement of the head at the neck.  No recent trauma/injuries to the neck that she is aware of.  No history of past surgical procedures to the cervical spine.  She is not experiencing any midline cervical tenderness.  Pain starts on both sides of the neck and radiates downwards towards both shoulders and intermittently radiates upwards to the head.  She is unsure of anything that makes pain better.  No numbness or tingling to the bilateral upper or lower extremities, cough, fever/chills, abdominal pain, sore throat, vision changes, or viral URI symptoms.  She has been using Tylenol as needed for pain without much relief.   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    OB History     Gravida  6   Para  4   Term  4   Preterm      AB  2   Living  4       SAB  1   IAB  1   Ectopic      Multiple      Live Births  1            Home Medications    Prior to Admission medications   Medication Sig Start Date End Date Taking? Authorizing Provider  methocarbamol (ROBAXIN) 500 MG tablet Take 1 tablet (500 mg total) by mouth 2 (two) times daily. 05/05/22  Yes Talbot Grumbling, FNP  benzonatate (TESSALON) 100 MG capsule Take 1 capsule (100 mg total) by mouth every 8 (eight) hours. Patient not taking: Reported on 02/16/2017 11/11/16   Caccavale, Sophia, PA-C  fluticasone (FLONASE) 50 MCG/ACT nasal spray Place 1 spray into both nostrils daily. Patient not taking: Reported on 02/16/2017 11/11/16   Caccavale, Sophia, PA-C  HYDROcodone-acetaminophen (NORCO/VICODIN) 5-325 MG per tablet Take 1 tablet by mouth every 6 (six) hours as needed for moderate pain. Patient not taking: Reported on 01/28/2015 01/30/14   Milton Ferguson, MD  ondansetron (ZOFRAN) 4 MG tablet Take 1 tablet (4 mg total) by mouth every 8 (eight) hours as needed for nausea or vomiting. Patient not taking: Reported on 02/16/2017 01/28/15   Mackuen, Courteney Lyn, MD  phenol (CHLORASEPTIC) 1.4 % LIQD Use as directed 1 spray in the mouth or  throat as needed for throat irritation / pain. Patient not taking: Reported on 02/16/2017 11/11/16   Franchot Heidelberg, PA-C    Family History Family History  Problem Relation Age of Onset   Hypertension Mother    Diabetes Mother    Anesthesia problems Neg Hx     Social History Social History   Tobacco Use   Smoking status: Some Days    Years: 3.00    Types: Cigarettes   Smokeless tobacco: Never  Vaping Use   Vaping Use: Never used  Substance Use Topics   Alcohol use: No   Drug use: No     Allergies   Patient has no known allergies.   Review of Systems Review of Systems  Musculoskeletal:  Positive for neck pain.  Per HPI   Physical Exam Triage Vital Signs ED Triage Vitals  Enc Vitals Group     BP 05/05/22  1114 120/86     Pulse Rate 05/05/22 1114 64     Resp 05/05/22 1114 14     Temp 05/05/22 1114 98.7 F (37.1 C)     Temp Source 05/05/22 1114 Oral     SpO2 05/05/22 1114 97 %     Weight --      Height --      Head Circumference --      Peak Flow --      Pain Score 05/05/22 1116 9     Pain Loc --      Pain Edu? --      Excl. in [place]? --    No data found.  Updated Vital Signs BP 120/86 (BP Location: Left Arm)   Pulse 64   Temp 98.7 F (37.1 C) (Oral)   Resp 14   LMP 05/03/2022   SpO2 97%   Visual Acuity Right Eye Distance:   Left Eye Distance:   Bilateral Distance:    Right Eye Near:   Left Eye Near:    Bilateral Near:     Physical Exam Vitals and nursing note reviewed.  Constitutional:      Appearance: She is not ill-appearing or toxic-appearing.  HENT:     Head: Normocephalic and atraumatic.     Right Ear: Hearing and external ear normal.     Left Ear: Hearing and external ear normal.     Nose: Nose normal.     Mouth/Throat:     Lips: Pink.  Eyes:     General: Lids are normal. Vision grossly intact. Gaze aligned appropriately.     Extraocular Movements: Extraocular movements intact.     Conjunctiva/sclera: Conjunctivae normal.  Neck:     Thyroid: No thyroid tenderness.     Trachea: Trachea normal.     Comments: TTP to the bilateral trapezius muscles and cervical paraspinals with decreased range of motion of head movement at the neck.  5/5 strength to bilateral upper and lower extremities with sensation intact bilateral upper and lower extremities. Cardiovascular:     Rate and Rhythm: Normal rate and regular rhythm.     Heart sounds: Normal heart sounds, S1 normal and S2 normal.  Pulmonary:     Effort: Pulmonary effort is normal. No respiratory distress.     Breath sounds: Normal breath sounds and air entry.  Musculoskeletal:     Cervical back: Neck supple. No edema, erythema, signs of trauma, rigidity, torticollis or crepitus. Pain with movement and muscular  tenderness present. No spinous process tenderness. Decreased range of motion.  Lymphadenopathy:  Cervical: No cervical adenopathy.  Skin:    General: Skin is warm and dry.     Capillary Refill: Capillary refill takes less than 2 seconds.     Findings: No rash.  Neurological:     General: No focal deficit present.     Mental Status: She is alert and oriented to person, place, and time. Mental status is at baseline.     Cranial Nerves: No dysarthria or facial asymmetry.  Psychiatric:        Mood and Affect: Mood normal.        Speech: Speech normal.        Behavior: Behavior normal.        Thought Content: Thought content normal.        Judgment: Judgment normal.      UC Treatments / Results  Labs (all labs ordered are listed, but only abnormal results are displayed) Labs Reviewed - No data to display  EKG   Radiology No results found.  Procedures Procedures (including critical care time)  Medications Ordered in UC Medications  acetaminophen (TYLENOL) tablet 975 mg (975 mg Oral Given 05/05/22 1159)  ketorolac (TORADOL) 30 MG/ML injection 30 mg (30 mg Intramuscular Given 05/05/22 1200)    Initial Impression / Assessment and Plan / UC Course  I have reviewed the triage vital signs and the nursing notes.  Pertinent labs & imaging results that were available during my care of the patient were reviewed by me and considered in my medical decision making (see chart for details).   1. Strain of neck muscle Presentation is consistent with acute muscle strain of the neck that will likely resolve with rest, fluids, as needed use of ibuprofen and muscle relaxer, heat, and gentle range of motion exercises. May take ibuprofen 600 mg every 6 hours and Robaxin muscle relaxer every 12 hours as needed for muscle spasm.  Advised to avoid taking NSAIDs until tomorrow due to ketorolac 30 mg IM in clinic given for acute pain and inflammation.  Drowsiness precautions regarding muscle relaxer use  discussed. Heat and gentle ROM exercises discussed. Deferred imaging today based on stable musculoskeletal exam findings and hemodynamically stable vital signs. Walking referral given to orthopedic provider should symptoms fail to improve in the next 1-2 weeks.   Discussed physical exam and available lab work findings in clinic with patient.  Counseled patient regarding appropriate use of medications and potential side effects for all medications recommended or prescribed today. Discussed red flag signs and symptoms of worsening condition,when to call the PCP office, return to urgent care, and when to seek higher level of care in the emergency department. Patient verbalizes understanding and agreement with plan. All questions answered. Patient discharged in stable condition.    Final Clinical Impressions(s) / UC Diagnoses   Final diagnoses:  Strain of neck muscle, initial encounter     Discharge Instructions      Your pain is likely due to a muscle strain which will improve on its own with time.   - Take ibuprofen '600mg'$  with food every 6 hours as needed for pain and inflammation. Do not take any other NSAID containing medicine when taking ibuprofen.   - You may start taking ibuprofen tomorrow since you were given a dose of ketorolac injection in clinic today for pain and inflammation. - You may also take the prescribed muscle relaxer as directed as needed for muscle aches/spasm.  Do not take this medication and drive or drink alcohol as it can make  you sleepy.  Mainly use this medicine at nighttime as needed. - Apply heat 20 minutes on then 20 minutes off and perform gentle range of motion exercises to the area of greatest pain to prevent muscle stiffness and provide further pain relief.   Red flag symptoms to watch out for are numbness/tingling to the legs, weakness, loss of bowel/bladder control, and/or worsening pain that does not respond well to medicines. Follow-up with your primary care  provider or return to urgent care if your symptoms do not improve in the next 3 to 4 days with medications and interventions recommended today. If your symptoms are severe (red flag), please go to the emergency room.  I hope you feel better!    ED Prescriptions     Medication Sig Dispense Auth. Provider   methocarbamol (ROBAXIN) 500 MG tablet Take 1 tablet (500 mg total) by mouth 2 (two) times daily. 20 tablet Talbot Grumbling, FNP      PDMP not reviewed this encounter.   Talbot Grumbling, [place] 05/09/22 2232

## 2023-06-26 ENCOUNTER — Encounter (HOSPITAL_COMMUNITY): Payer: Self-pay | Admitting: Emergency Medicine

## 2023-06-26 ENCOUNTER — Other Ambulatory Visit: Payer: Self-pay

## 2023-06-26 ENCOUNTER — Ambulatory Visit (HOSPITAL_COMMUNITY)
Admission: EM | Admit: 2023-06-26 | Discharge: 2023-06-26 | Disposition: A | Discharge status: Home / Self Care | Payer: Self-pay | Attending: Internal Medicine | Admitting: Internal Medicine

## 2023-06-26 DIAGNOSIS — N6320 Unspecified lump in the left breast, unspecified quadrant: Secondary | ICD-10-CM

## 2023-06-26 DIAGNOSIS — H6121 Impacted cerumen, right ear: Secondary | ICD-10-CM

## 2023-06-26 DIAGNOSIS — N6310 Unspecified lump in the right breast, unspecified quadrant: Secondary | ICD-10-CM

## 2023-06-26 DIAGNOSIS — D2330 Other benign neoplasm of skin of unspecified part of face: Secondary | ICD-10-CM

## 2023-06-26 DIAGNOSIS — B372 Candidiasis of skin and nail: Secondary | ICD-10-CM

## 2023-06-26 MED ORDER — NYSTATIN 100000 UNIT/GM EX CREA: TOPICAL_CREAM | CUTANEOUS | 3 refills | Status: DC

## 2023-06-26 NOTE — Discharge Instructions (Addendum)
 The following medical conditions were addressed today: Bilateral breast lumps with tenderness: will order ultrasound and if needed, mammogram pending ultrasound results. Need to schedule a follow up with gynecology for further evaluation.  Please sign up for MyChart as your results will be available here Epidermal inclusion cyst of the anterior left ear.  These are usually benign structures.  This would need to be removed by a dermatologist, general surgeon or plastic surgeon.  If it becomes red, painful and enlarged then it may need to be drained.  Itching is a normal sign for these. Impacted cerumen of the right ear: Irrigation of the right ear done today.  After irrigation the tympanic membrane (eardrum) is normal. Rash on the chest: This appears to be cutaneous Candida.  We will treat this with nystatin cream twice daily for 14 days.  May continue if the rash persist.  May use as needed after. Return to urgent care or PCP if symptoms worsen or fail to resolve.

## 2023-06-26 NOTE — ED Triage Notes (Signed)
 Pt is c/o having breast problems with her breasts for the past 5 months and few days with problems on her ears.

## 2023-06-26 NOTE — ED Provider Notes (Signed)
 [facility]    CSN: [id number] Arrival date & time: 06/26/23  1001      History   Chief Complaint Chief Complaint  Patient presents with   Breast Problem   Ear Fullness    HPI Diana Liu is a 43 y.o. female.   43 y.o. female who presents to urgent care with several complaints including a persistent mass at the left anterior ear, intermittent bilateral breast lumps with tenderness, rash between the breast and under the breast and a popping sensation in the right ear.  1. Left anterior Ear with epidermal inclusion cyst:  This has been present for at least 1 year, itches at times, no infeciton/drainage.  Has not changed much since it initially developed.  2. Breast lumps that come and go: She reports that over the last 5 months she has developed tender areas in both breast with diffuse tenderness overall.  She reports that the lumps tend to come and go.  Sometimes they are more lateral sometimes superior, sometimes more medial.  There are 2 that tend to be more present.  One is on the right breast around 10:00 and the other is on the left breast more medial around 7:00 just outside of the areola.  She has never had a mammogram.  She denies any nipple discharge.  She denies any family history of breast cancer.   3. Rash between breast: This started about 5 months ago as well.  This tends to come and go.  It can be itchy at times.  It extends in the mid chest between the breast and under the breast.  It is flaky and dry at times.  4. Popping in right ear: She has noted some popping and snapping in her right ear that has been going on for quite some time.  She denies any significant pain.  She denies loss of hearing.   Ear Fullness Pertinent negatives include no chest pain, no abdominal pain and no shortness of breath.    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    OB History     Gravida  6   Para  4   Term  4   Preterm      AB  2   Living  4      SAB  1   IAB  1   Ectopic      Multiple      Live Births  1            Home Medications    Prior to Admission medications   Medication Sig Start Date End Date Taking? Authorizing Provider  benzonatate  (TESSALON ) 100 MG capsule Take 1 capsule (100 mg total) by mouth every 8 (eight) hours. Patient  not taking: Reported on 02/16/2017 11/11/16   Caccavale, Sophia, PA-C  fluticasone  (FLONASE ) 50 MCG/ACT nasal spray Place 1 spray into both nostrils daily. Patient not taking: Reported on 02/16/2017 11/11/16   Caccavale, Sophia, PA-C  HYDROcodone -acetaminophen  (NORCO/VICODIN) 5-325 MG per tablet Take 1 tablet by mouth every 6 (six) hours as needed for moderate pain. Patient not taking: Reported on 01/28/2015 01/30/14   Zammit, Joseph, MD  methocarbamol  (ROBAXIN ) 500 MG tablet Take 1 tablet (500 mg total) by mouth 2 (two) times daily. 05/05/22   Starlene Eaton, FNP  ondansetron  (ZOFRAN ) 4 MG tablet Take 1 tablet (4 mg total) by mouth every 8 (eight) hours as needed for nausea or vomiting. Patient not taking: Reported on 02/16/2017 01/28/15   Mackuen, Courteney Lyn, MD  phenol (CHLORASEPTIC) 1.4 % LIQD Use as directed 1 spray in the mouth or throat as needed  for throat irritation / pain. Patient not taking: Reported on 02/16/2017 11/11/16   Caccavale, Sophia, PA-C    Family History Family History  Problem Relation Age of Onset   Hypertension Mother    Diabetes Mother    Anesthesia problems Neg Hx     Social History Social History   Tobacco Use   Smoking status: Some Days    Types: Cigarettes   Smokeless tobacco: Never  Vaping Use   Vaping status: Never Used  Substance Use Topics   Alcohol use: No   Drug use: No     Allergies   Patient has no known allergies.   Review of Systems Review of Systems  Constitutional:  Negative for chills and fever.  HENT:  Positive for ear pain. Negative for sore throat.        Lump anterior left ear  Eyes:  Negative for pain and visual disturbance.  Respiratory:  Negative for cough and shortness of breath.   Cardiovascular:  Negative for chest pain and palpitations.  Gastrointestinal:  Negative for abdominal pain and vomiting.  Genitourinary:  Negative for dysuria and hematuria.       Bilateral intermittent breast lumps with tenderness for at least 5 months  Musculoskeletal:  Negative for arthralgias and back pain.  Skin:  Positive for rash (between breast and under breast). Negative for color change.  Neurological:  Negative for seizures and syncope.  All other systems reviewed and are negative.    Physical Exam Triage Vital Signs ED Triage Vitals  Encounter Vitals Group     BP 06/26/23 1013 119/76     Systolic BP Percentile --      Diastolic BP Percentile --      Pulse Rate 06/26/23 1013 68     Resp 06/26/23 1013 18     Temp 06/26/23 1013 98 F (36.7 C)     Temp Source 06/26/23 1013 Oral     SpO2 06/26/23 1013 98 %     Weight --      Height --      Head Circumference --      Peak Flow --      Pain Score 06/26/23 1014 0     Pain Loc --      Pain Education --      Exclude from Growth Chart --    No data found.  Updated Vital Signs BP 119/76 (BP Location: Right Arm)    Pulse 68   Temp 98 F (36.7 C) (Oral)   Resp 18   SpO2 98%   Visual Acuity Right Eye Distance:   Left Eye  Distance:   Bilateral Distance:    Right Eye Near:   Left Eye Near:    Bilateral Near:     Physical Exam Vitals and nursing note reviewed.  Constitutional:      General: She is not in acute distress.    Appearance: She is well-developed.  HENT:     Head: Normocephalic and atraumatic.     Right Ear: There is impacted cerumen.     Left Ear: Tympanic membrane and ear canal normal.     Nose: Nose normal.  Eyes:     Conjunctiva/sclera: Conjunctivae normal.  Cardiovascular:     Rate and Rhythm: Normal rate and regular rhythm.     Heart sounds: No murmur heard. Pulmonary:     Effort: Pulmonary effort is normal. No respiratory distress.     Breath sounds: Normal breath sounds.  Chest:       Comments: Bilateral breast are tender to palp, some occluded pores noted in the axilla bilateral with small cyst like structures underneath Abdominal:     General: Bowel sounds are normal.     Palpations: Abdomen is soft.     Tenderness: There is no abdominal tenderness.  Musculoskeletal:        General: No swelling.     Cervical back: Neck supple.  Skin:    General: Skin is warm and dry.     Capillary Refill: Capillary refill takes less than 2 seconds.  Neurological:     Mental Status: She is alert.  Psychiatric:        Mood and Affect: Mood normal.     UC Treatments / Results  Labs (all labs ordered are listed, but only abnormal results are displayed) Labs Reviewed - No data to display  EKG   Radiology No results found.  Procedures Procedures (including critical care time)  Medications Ordered in UC Medications - No data to display  Initial Impression / Assessment and Plan / UC Course  I have reviewed the triage vital signs and the nursing notes.  Pertinent labs & imaging results that were available during my care of the patient were reviewed by me and  considered in my medical decision making (see chart for details).     Bilateral breast lump  Cyst, dermoid, face  Impacted cerumen of right ear  Cutaneous candidiasis   The following medical conditions were addressed today: Bilateral breast lumps with tenderness: will order ultrasound and if needed, mammogram pending ultrasound results. Need to schedule a follow up with gynecology for further evaluation.  Please sign up for MyChart as your results will be available here Epidermal inclusion cyst of the anterior left ear.  These are usually benign structures.  This would need to be removed by a dermatologist, general surgeon or plastic surgeon.  If it becomes red, painful and enlarged then it may need to be drained.  Itching is a normal sign for these. Impacted cerumen of the right ear: Irrigation of the right ear done today.  After irrigation the tympanic membrane (eardrum) is normal. Rash on the chest: This appears to be cutaneous Candida.  We will treat this with nystatin cream twice daily for 14 days.  May continue if the rash persist.  May use as needed after. Return to urgent care or PCP if symptoms worsen or fail to resolve.    Final Clinical Impressions(s) / UC Diagnoses   Final diagnoses:  None   Discharge Instructions   None    ED Prescriptions   None  PDMP not reviewed this encounter.   Kreg Pesa, [place] 06/26/23 1206

## 2023-06-27 ENCOUNTER — Other Ambulatory Visit (HOSPITAL_COMMUNITY): Payer: Self-pay | Admitting: Internal Medicine

## 2023-06-27 DIAGNOSIS — N6310 Unspecified lump in the right breast, unspecified quadrant: Secondary | ICD-10-CM

## 2023-06-28 ENCOUNTER — Other Ambulatory Visit: Payer: Self-pay

## 2023-06-28 DIAGNOSIS — N6320 Unspecified lump in the left breast, unspecified quadrant: Secondary | ICD-10-CM

## 2023-06-28 DIAGNOSIS — N6310 Unspecified lump in the right breast, unspecified quadrant: Secondary | ICD-10-CM

## 2023-07-14 ENCOUNTER — Ambulatory Visit: Payer: Self-pay

## 2023-07-28 ENCOUNTER — Ambulatory Visit: Payer: Self-pay | Admitting: *Deleted

## 2023-07-28 VITALS — BP 130/86 | Wt 211.0 lb

## 2023-07-28 DIAGNOSIS — N6315 Unspecified lump in the right breast, overlapping quadrants: Secondary | ICD-10-CM

## 2023-07-28 DIAGNOSIS — N6325 Unspecified lump in the left breast, overlapping quadrants: Secondary | ICD-10-CM

## 2023-07-28 DIAGNOSIS — N898 Other specified noninflammatory disorders of vagina: Secondary | ICD-10-CM

## 2023-07-28 DIAGNOSIS — Z01419 Encounter for gynecological examination (general) (routine) without abnormal findings: Secondary | ICD-10-CM

## 2023-07-28 NOTE — Patient Instructions (Signed)
 Explained breast self awareness with Diana Liu. Pap smear completed today. Let her know if today's Pap smear is normal and HPV negative that her next Pap smear will be due in one year due to her history of an abnormal Pap smear. Referred patient to the [facility] for a diagnostic mammogram. Appointment scheduled Monday, August 08, 2023 at 1420. Patient aware of appointment and will be there. Let patient know will follow up with her within the next couple weeks with results of Pap smear by letter or phone. Diana Liu verbalized understanding.  [name], [name], RN 2:25 PM

## 2023-07-28 NOTE — Progress Notes (Addendum)
 [name] is a 43 y.o. [phone] female who presents to [facility] clinic today with complaint of bilateral mobile breast lumps x 7-8 months and right breast pain. Patient states the pain comes and goes. Patient rates the pain at a 7-8 out of 10.   Pap Smear: Pap smear completed today. 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 Per patient her last Pap smear is the only abnormal Pap smear that she has had. Last Pap smear result is available in Epic.   Physical exam: Breasts Breasts symmetrical. No skin abnormalities bilateral breasts. No nipple retraction bilateral breasts. No nipple discharge bilateral breasts. No lymphadenopathy. Palpated a lump within the right breast at 6 o'clock 2 cm from the nipple. Palpated two lumps within the left breast at 3 o'clock 4 cm from the nipple and 9 o'clock 2 cm from the nipple. Complaints of tenderness when palpated the right breast lump on exam.  Pelvic/Bimanual Ext Genitalia No lesions, no swelling and no discharge observed on external genitalia.        Vagina Vagina pink and normal texture. No lesions and thick white discharge observed in vagina. Wet prep completed.        Cervix Cervix is present. Cervix pink and of normal texture. Thick white discharge observed on cervix.   Uterus Uterus is present and palpable. Uterus in normal position and normal size.        Adnexae Bilateral ovaries present and palpable. No tenderness on palpation.         Rectovaginal No rectal exam completed today since patient had no rectal complaints. No skin abnormalities observed on exam.     Smoking History: Patient is a current smoker. Discussed smoking cessation with patient. Referred to the [facility] Quitline.   Patient Navigation: Patient education provided. Access to services provided for patient through [facility]  program.    Breast and Cervical Cancer Risk Assessment: Patient does not have family history of breast cancer, known genetic mutations, or radiation treatment to the chest before age 85. Patient has history of cervical dysplasia. Patient has no history of being immunocompromised or DES exposure in-utero.  Risk Scores as of Encounter on 07/28/2023     Diana Liu           5-year 0.29%   Lifetime 5.65%            Last calculated by Silas, Ansyi K, CMA on 07/28/2023 at  2:53 PM       A: [facility] exam with pap smear Complaint of bilateral breast lumps and right breast pain.  P: Referred patient to the [facility] for a diagnostic mammogram. Appointment scheduled Monday, August 08, 2023 at 1420.  Diana Edge, RN 07/28/2023 2:25 PM

## 2023-07-29 ENCOUNTER — Other Ambulatory Visit: Payer: Self-pay

## 2023-07-29 ENCOUNTER — Telehealth: Payer: Self-pay

## 2023-07-29 LAB — CERVICOVAGINAL ANCILLARY ONLY
Bacterial Vaginitis (gardnerella): POSITIVE — AB
Candida Glabrata: NEGATIVE
Candida Vaginitis: NEGATIVE
Comment: NEGATIVE
Comment: NEGATIVE
Comment: NEGATIVE
Comment: NEGATIVE
Trichomonas: POSITIVE — AB

## 2023-07-29 MED ORDER — METRONIDAZOLE 500 MG PO TABS: 500.0000 mg | ORAL_TABLET | Freq: Two times a day (BID) | ORAL | 0 refills | Status: AC

## 2023-07-29 NOTE — Telephone Encounter (Signed)
 Patient informed wet prep results positive for Bacterial vaginitis and Trichomonas, needs rx metronidazole, avoid sexual intercourse during treatment until she and partner have been treated, avoid alcohol during treatment. Discussed both BV and Trichomonas with patient, all questions answered. Rx sent to [facility]. [facility].

## 2023-07-29 NOTE — Telephone Encounter (Signed)
 Attempted to contact patient regarding lab results. Left message on voicemail requesting a return call.

## 2023-08-02 LAB — CYTOLOGY - PAP
Comment: NEGATIVE
Diagnosis: UNDETERMINED — AB
High risk HPV: NEGATIVE

## 2023-08-08 ENCOUNTER — Ambulatory Visit
Admission: RE | Admit: 2023-08-08 | Discharge: 2023-08-08 | Disposition: A | Discharge status: Home / Self Care | Payer: Self-pay | Source: Ambulatory Visit | Attending: Obstetrics and Gynecology | Admitting: Obstetrics and Gynecology

## 2023-08-08 ENCOUNTER — Telehealth: Payer: Self-pay

## 2023-08-08 DIAGNOSIS — N6320 Unspecified lump in the left breast, unspecified quadrant: Secondary | ICD-10-CM

## 2023-08-08 DIAGNOSIS — N6310 Unspecified lump in the right breast, unspecified quadrant: Secondary | ICD-10-CM

## 2023-08-08 NOTE — Telephone Encounter (Signed)
 Pt called very upset and stating that "we reached into her phone and deleted the text she was supposed to respond to for her ride home because you wanted me stranded".   I advised the pt that we do not have access to her cell phone or any of it's contents and attempted to help her locate the text but she stated it was gone but all other text messages remained in her device and felt we perceived her as lying. Pt then disconnected the call and attempted to reach another member of the department who was not in the office at this time, so I again answered the call, which upset the pt again and she expressed that she feels I deleted the message from her phone to be rude and leave her stranded and pt disconnected the call again.  I waited a few minutes and called the pt back in an attempt to help get her home. At this time the pt was able to tell me where she was located (still at the [facility], [address]) and I advised her to return to the main entrance of the building so I could send a taxi to pick her up. Pt was advised to please stay at the front entrance so she does not miss her taxi because we cannot dispatch them a second time. Pt expressed understanding of this information and advised she would wait for [employer] taxi at the main entrance. Pt was advised I spoke with staff member, Gerrie Krebs, to make her arrangements.   This encounter has been reported to [employer].
